# Patient Record
Sex: Male | Born: 1970
Health system: Southern US, Community
[De-identification: ages and names within clinical notes are randomized; demographics above are authoritative.]

## PROBLEM LIST (undated history)

## (undated) DIAGNOSIS — F32A Depression, unspecified: Secondary | ICD-10-CM

## (undated) DIAGNOSIS — Z8619 Personal history of other infectious and parasitic diseases: Secondary | ICD-10-CM

## (undated) DIAGNOSIS — I10 Essential (primary) hypertension: Secondary | ICD-10-CM

## (undated) DIAGNOSIS — G4730 Sleep apnea, unspecified: Secondary | ICD-10-CM

## (undated) DIAGNOSIS — E079 Disorder of thyroid, unspecified: Secondary | ICD-10-CM

## (undated) HISTORY — PX: BRAIN SURGERY: SHX531

## (undated) HISTORY — DX: Depression, unspecified: F32.A

## (undated) HISTORY — DX: Sleep apnea, unspecified: G47.30

## (undated) HISTORY — DX: Disorder of thyroid, unspecified: E07.9

## (undated) HISTORY — DX: Essential (primary) hypertension: I10

## (undated) HISTORY — DX: Personal history of other infectious and parasitic diseases: Z86.19

---
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
## 1999-11-09 ENCOUNTER — Ambulatory Visit (HOSPITAL_BASED_OUTPATIENT_CLINIC_OR_DEPARTMENT_OTHER): Admission: RE | Admit: 1999-11-09 | Discharge: 1999-11-09 | Payer: Self-pay | Admitting: Plastic Surgery

## 2003-03-25 ENCOUNTER — Ambulatory Visit (HOSPITAL_BASED_OUTPATIENT_CLINIC_OR_DEPARTMENT_OTHER): Admission: RE | Admit: 2003-03-25 | Discharge: 2003-03-25 | Payer: Self-pay | Admitting: Orthopedic Surgery

## 2004-03-13 HISTORY — PX: KNEE ARTHROSCOPY W/ ACL RECONSTRUCTION: SHX1858

## 2004-10-07 ENCOUNTER — Encounter: Admission: RE | Admit: 2004-10-07 | Discharge: 2004-10-07 | Payer: Self-pay | Admitting: Family Medicine

## 2011-05-30 ENCOUNTER — Ambulatory Visit (INDEPENDENT_AMBULATORY_CARE_PROVIDER_SITE_OTHER): Payer: BC Managed Care – PPO | Admitting: Family Medicine

## 2011-05-30 ENCOUNTER — Ambulatory Visit: Payer: BC Managed Care – PPO

## 2011-05-30 VITALS — BP 147/82 | HR 61 | Temp 98.5°F | Resp 16 | Ht 74.0 in | Wt 215.0 lb

## 2011-05-30 DIAGNOSIS — M79609 Pain in unspecified limb: Secondary | ICD-10-CM

## 2011-05-30 DIAGNOSIS — M7660 Achilles tendinitis, unspecified leg: Secondary | ICD-10-CM

## 2011-05-30 DIAGNOSIS — M79673 Pain in unspecified foot: Secondary | ICD-10-CM

## 2011-05-30 MED ORDER — TRAMADOL HCL 50 MG PO TABS: 50.0000 mg | ORAL_TABLET | Freq: Three times a day (TID) | ORAL | Status: AC | PRN

## 2011-05-30 NOTE — Progress Notes (Signed)
[facility]:  Office Visit  Chief Complaint:  Chief Complaint  Patient presents with  . Foot Pain    left heel pain  . Foot Swelling    HPI: Louis Perkins is a 41 y.o. male who complains of  2 week hx of left heel pain after started running on track after not having ran for a long time, sharp constant 9/10 pain going up his achilles. No arch pain. Denies steroids or antibiotic use. No hx of stress fx or osteoarthritis or prior taruam or injury. He was running on track when this happened. No prior episode. Described as intermittinet pain then became constant about 1 week ago. Has not tried anything for it except 1 x dose of NSAID, minimal relief  History reviewed. No pertinent past medical history. History reviewed. No pertinent past surgical history. History   Social History  . Marital Status: Divorced    Spouse Name: N/A    Number of Children: N/A  . Years of Education: N/A   Social History Main Topics  . Smoking status: Never Smoker   . Smokeless tobacco: None  . Alcohol Use: Yes  . Drug Use: No  . Sexually Active: None   Other Topics Concern  . None   Social History Narrative  . None   Family History  Problem Relation Age of Onset  . Heart disease Father    No Known Allergies Prior to Admission medications   Not on File     ROS: The patient denies fevers, chills, night sweats, unintentional weight loss, chest pain, palpitations, wheezing, dyspnea on exertion, nausea, vomiting, abdominal pain, dysuria, hematuria, melena, numbness, weakness, or tingling. + left heel and achilles tendon pain  All other systems have been reviewed and were otherwise negative with the exception of those mentioned in the HPI and as above.    PHYSICAL EXAM: Filed Vitals:   05/30/11 1159  BP: 147/82  Pulse: 61  Temp: 98.5 F (36.9 C)  Resp: 16   Filed Vitals:   05/30/11 1159  Height: 6\' 2"  (1.88 m)  Weight: 215 lb (97.523 kg)   Body mass index is 27.60  kg/(m^2).  General: Alert, no acute distress HEENT:  Normocephalic, atraumatic, oropharynx patent.  Cardiovascular:  Regular rate and rhythm, no rubs murmurs or gallops.  No Carotid bruits, radial pulse intact. No pedal edema.  Respiratory: Clear to auscultation bilaterally.  No wheezes, rales, or rhonchi.  No cyanosis, no use of accessory musculature GI: No organomegaly, abdomen is soft and non-tender, positive bowel sounds.  No masses. Skin: No rashes. Neurologic: Facial musculature symmetric. Psychiatric: Patient is appropriate throughout our interaction. Lymphatic: No cervical lymphadenopathy Musculoskeletal: Gait intact. Left foot:  Full ROM, 5/5 strength, sensation intact, 2/2  Ankle DTR. + tender at left heel, posterior achilles, no evidence of tendon rupture.    LABS: No results found for this or any previous visit.   EKG/XRAY:   Primary read interpreted by Dr. Conley Rolls at [facility]. No fx or dislocation. ? Small heel spur   ASSESSMENT/PLAN: Encounter Diagnoses  Name Primary?  . Heel pain Yes  . Achilles tendinitis    Etiology? Achilles Tendonitis vs Plantar Fasciitis. I think it may be more consistent with achilles tendonitis but potentially could be PF ( however sxs not completely c/w PF) Sxs treatment with Motrin 800 mg q 8 hrs, heel inserts, if breakthrough pain then Tramdol prn. Handout for Achilles tendonitis given    Louis Heggie PHUONG, DO 05/30/2011 1:37  PM

## 2011-07-18 ENCOUNTER — Emergency Department (HOSPITAL_COMMUNITY)
Admission: EM | Admit: 2011-07-18 | Discharge: 2011-07-18 | Payer: BC Managed Care – PPO | Source: Home / Self Care | Attending: Emergency Medicine | Admitting: Emergency Medicine

## 2011-07-19 ENCOUNTER — Ambulatory Visit (INDEPENDENT_AMBULATORY_CARE_PROVIDER_SITE_OTHER): Payer: Self-pay | Admitting: Family Medicine

## 2011-07-19 ENCOUNTER — Encounter: Payer: Self-pay | Admitting: Family Medicine

## 2011-07-19 VITALS — BP 146/88 | HR 64 | Temp 98.2°F | Ht 72.5 in | Wt 219.8 lb

## 2011-07-19 DIAGNOSIS — Z8249 Family history of ischemic heart disease and other diseases of the circulatory system: Secondary | ICD-10-CM | POA: Insufficient documentation

## 2011-07-19 DIAGNOSIS — R030 Elevated blood-pressure reading, without diagnosis of hypertension: Secondary | ICD-10-CM | POA: Insufficient documentation

## 2011-07-19 DIAGNOSIS — M7541 Impingement syndrome of right shoulder: Secondary | ICD-10-CM

## 2011-07-19 DIAGNOSIS — IMO0001 Reserved for inherently not codable concepts without codable children: Secondary | ICD-10-CM | POA: Insufficient documentation

## 2011-07-19 DIAGNOSIS — M7540 Impingement syndrome of unspecified shoulder: Secondary | ICD-10-CM | POA: Insufficient documentation

## 2011-07-19 DIAGNOSIS — M7660 Achilles tendinitis, unspecified leg: Secondary | ICD-10-CM | POA: Insufficient documentation

## 2011-07-19 LAB — COMPREHENSIVE METABOLIC PANEL
ALT: 32 U/L (ref 0–53)
AST: 36 U/L (ref 0–37)
Albumin: 4 g/dL (ref 3.5–5.2)
Alkaline Phosphatase: 43 U/L (ref 39–117)
BUN: 23 mg/dL (ref 6–23)
CO2: 28 mEq/L (ref 19–32)
Calcium: 9 mg/dL (ref 8.4–10.5)
Chloride: 102 mEq/L (ref 96–112)
Creatinine, Ser: 1.4 mg/dL (ref 0.4–1.5)
GFR: 71.87 mL/min (ref >60.00)
Glucose, Bld: 87 mg/dL (ref 70–99)
Potassium: 4.1 mEq/L (ref 3.5–5.1)
Sodium: 137 mEq/L (ref 135–145)
Total Bilirubin: 0.7 mg/dL (ref 0.3–1.2)
Total Protein: 7.2 g/dL (ref 6.0–8.3)

## 2011-07-19 LAB — LIPID PANEL
Cholesterol: 146 mg/dL (ref 0–200)
HDL: 57.3 mg/dL (ref >39.00)
LDL Cholesterol: 81 mg/dL (ref 0–99)
Total CHOL/HDL Ratio: 3
Triglycerides: 38 mg/dL (ref 0.0–149.0)
VLDL: 7.6 mg/dL (ref 0.0–40.0)

## 2011-07-19 NOTE — Progress Notes (Signed)
Subjective:    Patient ID: Louis Perkins, male    DOB: [birth year], 41 y.o.   MRN: [id number]  HPI  Very pleasant 41 yo male here to establish care.  Very active.  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  Achilles Tendinitis- left- went to [facility] a few weeks ago for left heel pain and diagnosed with Achilles Tendinitis.  Pain has been improving.  He ices the area after physical activity and has been using shoe inserts.    Right shoulder pain- was playing golf over the weekend- did not remember an injury.  Afterward, right shoulder very painful- difficulty lifting arm overhead.  No UE radiculopathy or weakness.  Pain improved with Ibuprofen but returns once Ibuprofen wears off.  No swelling.  Family h/o CAD- dad died of massive MI at 43.  Pt has not had lab work in over 2 years.  Tries to eat right.  Non smoker.  Elevated BP- never been told he has elevated BP in past.  BP elevated at urgent care but he was in pain.  Today was nervous about meeting new doctor.  No HA, blurred vision, CP or SOB. Sister is an Charity fundraiser.  Patient Active Problem List  Diagnoses  . Rotator cuff impingement syndrome, right  . Achilles tendonitis  . Family history of early CAD   Past Medical History  Diagnosis Date  . History of chicken pox    Past Surgical History  Procedure Date  . Adenoidectomy 1985   History  Substance Use Topics  . Smoking status: Never Smoker   . Smokeless tobacco: Not on file  . Alcohol Use: Yes   Family History  Problem Relation Age of Onset  . Heart disease Father   . Cancer Maternal Uncle     prostate CA  . Arthritis Maternal Grandmother   . Hypertension Maternal Grandmother   . Cancer Cousin     breast CA   No Known Allergies No current outpatient prescriptions on file prior to visit.   The PMH, PSH, Social History, Family History, Medications, and allergies have been reviewed in [facility], and have been updated if  relevant.    Review of Systems    See HPI Patient reports no  vision/ hearing changes,anorexia, weight change, fever ,adenopathy, persistant / recurrent hoarseness, swallowing issues, chest pain, edema,persistant / recurrent cough, hemoptysis, dyspnea(rest, exertional, paroxysmal nocturnal), gastrointestinal  bleeding (melena, rectal bleeding), abdominal pain, excessive heart burn, GU symptoms(dysuria, hematuria, pyuria, voiding/incontinence  Issues) syncope, focal weakness, severe memory loss, concerning skin lesions, depression, anxiety, abnormal bruising/bleeding, major joint swelling.    Objective:   Physical Exam  BP 146/88  Pulse 64  Temp(Src) 98.2 F (36.8 C) (Oral)  Ht 6' 0.5" (1.842 m)  Wt 219 lb 12 oz (99.678 kg)  BMI 29.39 kg/m2 General:  Muscular pleasant male, NAD. Eyes:  PERRL Ears:  External ear exam shows no significant lesions or deformities.  Otoscopic examination reveals clear canals, tympanic membranes are intact bilaterally without bulging, retraction, inflammation or discharge. Hearing is grossly normal bilaterally. Nose:  External nasal examination shows no deformity or inflammation. Nasal mucosa are pink and moist without lesions or exudates. Mouth:  Oral mucosa and oropharynx without lesions or exudates.  Teeth in good repair. Neck:  no carotid bruit or thyromegaly no cervical or supraclavicular lymphadenopathy  Lungs:  Normal respiratory effort, chest expands symmetrically. Lungs are clear to auscultation, no crackles or wheezes. Heart:  Normal rate and regular  rhythm. S1 and S2 normal without gallop, murmur, click, rub or other extra sounds. Abdomen:  Bowel sounds positive,abdomen soft and non-tender without masses, organomegaly or hernias noted. Pulses:  R and L posterior tibial pulses are full and equal bilaterally  Extremities:  no edema  MSK:  Right should normal to inspection and palpation.  Pos arch and pos empty can on right otherwise FROM and normal  strength.     Assessment & Plan:   1. Rotator cuff impingement syndrome, right New- supportive care with NSAIDs and exercises.  See pt instructions for details.  Also given handout from sports med advisor.   2. Achilles tendonitis  Improving.   3. Family history of early CAD  Comprehensive metabolic panel Lipid Panel  4. Elevated BP  New- likely white coat HTN. Asked pt to have his sister recheck BP and call us with readings. The patient indicates understanding of these issues and agrees with the plan.

## 2011-07-19 NOTE — Patient Instructions (Signed)
You have rotator cuff impingement Try to avoid painful activities (overhead activities, lifting with extended arm) as much as possible. Aleve and/or tylenol as needed for pain. Subacromial injection may be beneficial to help with pain and to decrease inflammation. Start exercises we discussed. If not improving at follow-up we will consider further imaging and/or physical therapy.  Please ask your sister to check you blood pressure a few times and call me with the readings.  We will call you with your lab results.

## 2011-09-05 ENCOUNTER — Ambulatory Visit (INDEPENDENT_AMBULATORY_CARE_PROVIDER_SITE_OTHER): Payer: BC Managed Care – PPO | Admitting: Family Medicine

## 2011-09-05 ENCOUNTER — Encounter: Payer: Self-pay | Admitting: Family Medicine

## 2011-09-05 VITALS — BP 110/82 | HR 78 | Temp 98.1°F | Wt 208.0 lb

## 2011-09-05 DIAGNOSIS — Z113 Encounter for screening for infections with a predominantly sexual mode of transmission: Secondary | ICD-10-CM

## 2011-09-05 DIAGNOSIS — H9190 Unspecified hearing loss, unspecified ear: Secondary | ICD-10-CM

## 2011-09-05 DIAGNOSIS — M7540 Impingement syndrome of unspecified shoulder: Secondary | ICD-10-CM

## 2011-09-05 DIAGNOSIS — H9191 Unspecified hearing loss, right ear: Secondary | ICD-10-CM | POA: Insufficient documentation

## 2011-09-05 DIAGNOSIS — M67919 Unspecified disorder of synovium and tendon, unspecified shoulder: Secondary | ICD-10-CM

## 2011-09-05 NOTE — Patient Instructions (Addendum)
Good to see you. Please stop by to see Louis Perkins on your way out to set up your referral. 

## 2011-09-05 NOTE — Progress Notes (Signed)
Subjective:    Patient ID: Louis Perkins, male    DOB: [birth year], 41 y.o.   MRN: [id number]  HPI  Very pleasant 41 yo male here for follow up right shoulder pain and right hearing loss.    Right shoulder pain- initially evaluated last month after was playing golf and right shoulder became very painful- difficulty lifting arm overhead.  No UE radiculopathy or weakness.  Tried conservative management- NSAIDs, exercises. Feeling much better, has an occasional "ache" in shoulder but otherwise feels remarkably better.  Right hearing loss- Has noticed over the past month, decreased hearing in right ear. No ear pain or ringing. No ear trauma.  He would like to get screened for HIV and RPR today.  No known exposures, no symptoms.  He likes to get tested once a year. No family h/o hearing loss. Patient Active Problem List  Diagnosis  . Rotator cuff impingement syndrome  . Achilles tendonitis  . Family history of early CAD  . Elevated BP  . Hearing loss in right ear   Past Medical History  Diagnosis Date  . History of chicken pox    Past Surgical History  Procedure Date  . Adenoidectomy 1985   History  Substance Use Topics  . Smoking status: Never Smoker   . Smokeless tobacco: Not on file  . Alcohol Use: Yes   Family History  Problem Relation Age of Onset  . Heart disease Father   . Cancer Maternal Uncle     prostate CA  . Arthritis Maternal Grandmother   . Hypertension Maternal Grandmother   . Cancer Cousin     breast CA   No Known Allergies No current outpatient prescriptions on file prior to visit.   The PMH, PSH, Social History, Family History, Medications, and allergies have been reviewed in [facility], and have been updated if relevant.    Review of Systems    See HPI   Objective:   Physical Exam BP 110/82  Pulse 78  Temp 98.1 F (36.7 C)  Wt 208 lb (94.348 kg)  General:  Muscular pleasant male, NAD. Eyes:  PERRL Ears:  External ear exam shows no  significant lesions or deformities.  Otoscopic examination reveals clear canals, tympanic membranes are intact bilaterally without bulging, retraction, inflammation or discharge. Hearing is grossly normal bilaterally. No cerumen visible Nose:  External nasal examination shows no deformity or inflammation. Nasal mucosa are pink and moist without lesions or exudates. Mouth:  Oral mucosa and oropharynx without lesions or exudates.  Teeth in good repair. Neck:  no carotid bruit or thyromegaly no cervical or supraclavicular lymphadenopathy  Lungs:  Normal respiratory effort, chest expands symmetrically. Lungs are clear to auscultation, no crackles or wheezes. Heart:  Normal rate and regular rhythm. S1 and S2 normal without gallop, murmur, click, rub or other extra sounds. Abdomen:  Bowel sounds positive,abdomen soft and non-tender without masses, organomegaly or hernias noted. Pulses:  R and L posterior tibial pulses are full and equal bilaterally  Extremities:  no edema  MSK:  Right should normal to inspection and palpation.  Neg arch and neg empty can on right with ROM and normal strength.     Assessment & Plan:   1. Rotator cuff impingement syndrome  Improved. Continue supportive care. Pt to let me know if symptoms deteriorate.   2. Hearing loss in right ear  New.  Does have hearing loss in right ear. Refer to audiology for further work up. Ambulatory referral to Audiology  3. Screening  for STD (sexually transmitted disease)  HIV Antibody, RPR

## 2011-09-06 LAB — RPR

## 2011-09-06 LAB — HIV ANTIBODY (ROUTINE TESTING W REFLEX): HIV: NONREACTIVE

## 2013-02-10 ENCOUNTER — Encounter: Payer: Self-pay | Admitting: Family Medicine

## 2013-02-10 ENCOUNTER — Ambulatory Visit (INDEPENDENT_AMBULATORY_CARE_PROVIDER_SITE_OTHER): Payer: BC Managed Care – PPO | Admitting: Family Medicine

## 2013-02-10 ENCOUNTER — Ambulatory Visit (INDEPENDENT_AMBULATORY_CARE_PROVIDER_SITE_OTHER)
Admission: RE | Admit: 2013-02-10 | Discharge: 2013-02-10 | Disposition: A | Discharge status: Home / Self Care | Payer: BC Managed Care – PPO | Source: Ambulatory Visit | Attending: Family Medicine | Admitting: Family Medicine

## 2013-02-10 VITALS — BP 100/80 | HR 52 | Temp 98.4°F | Ht 72.25 in | Wt 203.8 lb

## 2013-02-10 DIAGNOSIS — M25569 Pain in unspecified knee: Secondary | ICD-10-CM

## 2013-02-10 DIAGNOSIS — M259 Joint disorder, unspecified: Secondary | ICD-10-CM

## 2013-02-10 DIAGNOSIS — M222X2 Patellofemoral disorders, left knee: Secondary | ICD-10-CM

## 2013-02-10 DIAGNOSIS — M67919 Unspecified disorder of synovium and tendon, unspecified shoulder: Secondary | ICD-10-CM

## 2013-02-10 DIAGNOSIS — M25562 Pain in left knee: Secondary | ICD-10-CM

## 2013-02-10 DIAGNOSIS — M7541 Impingement syndrome of right shoulder: Secondary | ICD-10-CM

## 2013-02-10 MED ORDER — OXAPROZIN 600 MG PO TABS: 1200.0000 mg | ORAL_TABLET | Freq: Every day | ORAL | Status: DC

## 2013-02-10 NOTE — Progress Notes (Signed)
Pre-visit discussion using our clinic review tool. No additional management support is needed unless otherwise documented below in the visit note.  

## 2013-02-10 NOTE — Progress Notes (Signed)
Date:  02/10/2013   Name:  Louis Perkins   DOB:  04-13-1970   MRN:  [id number] Gender: male Age: 42 y.o.  Primary Physician:  Ruthe Mannan, MD   Chief Complaint: Knee Pain and Shoulder Pain   Subjective:   History of Present Illness:  Louis Perkins is a 42 y.o. very pleasant male patient who presents with the following:  Left knee pain Superior patella, started out doing squats, then a lot of pain afterwards, now even out of a chair.  Nothing with a L  R ACL reconstruction.  Took about a week off. Did some machine work. Then took about 2 weeks off.  Using some knee wraps.   Patient fairly anterior in nature. It is worse when he is going up and down stairs and when he does deep squats. He has not had any kind significant effusion. His knee is not buckling, and he is not having any mechanical symptoms. No history of LEFT knee operative intervention her fracture.  Right shoulder: Dr. Dayton Martes thought maybe a rotator cuff tendinopathy and impingement. All on the RIGHT. The patient does lift weights routinely and he is right-hand dominant. 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 Does do some light RTC work.  Never - dislocated.  RHD.   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  Past Medical History, Surgical History, Social History, Family History, Problem List, Medications, and Allergies have been reviewed and updated if relevant.  Review of Systems:  GEN: No fevers, chills. Nontoxic. Primarily MSK c/o today. MSK: Detailed in the HPI GI: tolerating PO intake without difficulty Neuro: No numbness, parasthesias, or tingling associated. Otherwise the pertinent positives of the ROS are noted above.   Objective:   Physical Examination: BP 100/80  Pulse 52  Temp(Src) 98.4 F (36.9 C) (Oral)  Ht 6' 0.25" (1.835 m)  Wt 203 lb 12 oz (92.42 kg)  BMI 27.45 kg/m2   GEN: Well-developed,well-nourished,in no acute distress;  alert,appropriate and cooperative throughout examination HEENT: Normocephalic and atraumatic without obvious abnormalities. Ears, externally no deformities PULM: Breathing comfortably in no respiratory distress EXT: No clubbing, cyanosis, or edema PSYCH: Normally interactive. Cooperative during the interview. Pleasant. Friendly and conversant. Not anxious or depressed appearing. Normal, full affect.  Shoulder: R Inspection: No muscle wasting or winging Ecchymosis/edema: neg  AC joint, scapula, clavicle: NT Cervical spine: NT, full ROM Spurling's: neg Abduction: full, 5/5 Flexion: full, 5/5 IR, full, lift-off: 5/5 - RELATIVE LOSS OF 40 DEG ON THE RIGHT COMPARED TO THE LEFT ER at neutral: full, 5/5 AC crossover: neg Neer: pos Hawkins: pos Drop Test: neg Empty Can: neg Supraspinatus insertion: nt Bicipital groove: NT Speed's: neg Yergason's: neg Sulcus sign: neg Scapular dyskinesis: none C5-T1 intact  Neuro: Sensation intact Grip 5/5   Knee: L Gait: Normal heel toe pattern ROM: WNL Effusion: neg Echymosis or edema: none Patellar tendon NT Painful PLICA: neg Patellar grind: POS - more superiorly Medial and lateral patellar facet loading: POSITIVE medial and lateral joint lines:NT Mcmurray's neg Flexion-pinch neg Varus and valgus stress: stable Lachman: neg Ant and Post drawer: neg Hip abduction, IR, ER: WNL Hip flexion str: 5/5 Hip abd: 5/5 Quad: 5/5 VMO atrophy: none Hamstring concentric and eccentric: 5/5   Radiology: Dg Knee Ap/lat W/sunrise Left  02/10/2013   CLINICAL DATA:  Left knee pain.  EXAM: DG KNEE - 3 VIEWS  COMPARISON:  None.  FINDINGS: There is no fracture  or dislocation. There is a small joint effusion. Minimal osteophytes on the patella.  IMPRESSION: Small joint effusion.  Slight degenerative changes of the patella.   Electronically Signed   By: Geanie Cooley M.D.   On: 02/10/2013 15:01    Assessment & Plan:    Left knee pain - Plan: DG Knee  AP/LAT W/Sunrise Left  Rotator cuff impingement syndrome, right  Patellofemoral disorder of left knee  We start him on some regular anti-inflammatories daily for the next 3-4 weeks. I suspected this is all coming from the patellofemoral joint which does have some mild arthritic change.  His shoulder I think is coming from GIRD, which is driving his impingement. I gave him some posterior capsular stretches from [facility], as well as some rotator cuff strengthening and scapular stabilization from them as well.  If he is still having some issues in a couple of months I asked him to followup with me  Orders Today:  Orders Placed This Encounter  Procedures  . DG Knee AP/LAT W/Sunrise Left    New medications, updates to list, dose adjustments: Meds ordered this encounter  Medications  . oxaprozin (DAYPRO) 600 MG tablet    Sig: Take 2 tablets (1,200 mg total) by mouth daily.    Dispense:  30 tablet    Refill:  4    Signed,  Conda Wannamaker T. Nuala Chiles, MD, CAQ Sports Medicine  [facility] [address] [place] [address] Phone: [phone] Fax: [phone]  Updated Complete Medication List:   Medication List       This list is accurate as of: 02/10/13 11:59 PM.  Always use your most recent med list.               oxaprozin 600 MG tablet  Commonly known as:  DAYPRO  Take 2 tablets (1,200 mg total) by mouth daily.

## 2013-04-23 ENCOUNTER — Other Ambulatory Visit: Payer: Self-pay | Admitting: Otolaryngology

## 2013-04-23 DIAGNOSIS — H9190 Unspecified hearing loss, unspecified ear: Secondary | ICD-10-CM

## 2013-05-01 ENCOUNTER — Ambulatory Visit
Admission: RE | Admit: 2013-05-01 | Discharge: 2013-05-01 | Disposition: A | Discharge status: Home / Self Care | Payer: BC Managed Care – PPO | Source: Ambulatory Visit | Attending: Otolaryngology | Admitting: Otolaryngology

## 2013-05-01 DIAGNOSIS — H9190 Unspecified hearing loss, unspecified ear: Secondary | ICD-10-CM

## 2013-05-05 ENCOUNTER — Ambulatory Visit
Admission: RE | Admit: 2013-05-05 | Discharge: 2013-05-05 | Disposition: A | Discharge status: Home / Self Care | Payer: BC Managed Care – PPO | Source: Ambulatory Visit | Attending: Otolaryngology | Admitting: Otolaryngology

## 2013-05-05 MED ORDER — GADOBENATE DIMEGLUMINE 529 MG/ML IV SOLN
20.0000 mL | Freq: Once | INTRAVENOUS | Status: AC | PRN
  Administered 2013-05-05: 20 mL via INTRAVENOUS

## 2013-07-04 DIAGNOSIS — Z86011 Personal history of benign neoplasm of the brain: Secondary | ICD-10-CM | POA: Insufficient documentation

## 2013-09-11 ENCOUNTER — Other Ambulatory Visit: Payer: Self-pay | Admitting: Otolaryngology

## 2013-09-11 DIAGNOSIS — H905 Unspecified sensorineural hearing loss: Secondary | ICD-10-CM

## 2013-09-11 DIAGNOSIS — Z86011 Personal history of benign neoplasm of the brain: Secondary | ICD-10-CM

## 2014-03-30 ENCOUNTER — Encounter (HOSPITAL_COMMUNITY): Payer: Self-pay | Admitting: Family Medicine

## 2014-03-30 ENCOUNTER — Emergency Department (HOSPITAL_COMMUNITY)
Admission: EM | Admit: 2014-03-30 | Discharge: 2014-03-30 | Disposition: A | Discharge status: Home / Self Care | Payer: 59 | Attending: Emergency Medicine | Admitting: Emergency Medicine

## 2014-03-30 DIAGNOSIS — Y998 Other external cause status: Secondary | ICD-10-CM | POA: Diagnosis not present

## 2014-03-30 DIAGNOSIS — Y92410 Unspecified street and highway as the place of occurrence of the external cause: Secondary | ICD-10-CM | POA: Diagnosis not present

## 2014-03-30 DIAGNOSIS — Y9389 Activity, other specified: Secondary | ICD-10-CM | POA: Insufficient documentation

## 2014-03-30 DIAGNOSIS — S161XXA Strain of muscle, fascia and tendon at neck level, initial encounter: Secondary | ICD-10-CM

## 2014-03-30 DIAGNOSIS — S0990XA Unspecified injury of head, initial encounter: Secondary | ICD-10-CM | POA: Diagnosis not present

## 2014-03-30 DIAGNOSIS — Z8619 Personal history of other infectious and parasitic diseases: Secondary | ICD-10-CM | POA: Insufficient documentation

## 2014-03-30 DIAGNOSIS — Z792 Long term (current) use of antibiotics: Secondary | ICD-10-CM | POA: Diagnosis not present

## 2014-03-30 DIAGNOSIS — S199XXA Unspecified injury of neck, initial encounter: Secondary | ICD-10-CM | POA: Diagnosis present

## 2014-03-30 MED ORDER — NAPROXEN 500 MG PO TABS
500.0000 mg | ORAL_TABLET | Freq: Two times a day (BID) | ORAL | Status: DC
Start: 2014-03-30 — End: 2016-08-21

## 2014-03-30 MED ORDER — HYDROCODONE-ACETAMINOPHEN 5-325 MG PO TABS: 2.0000 | ORAL_TABLET | ORAL | Status: DC | PRN

## 2014-03-30 MED ORDER — KETOROLAC TROMETHAMINE 60 MG/2ML IM SOLN
60.0000 mg | Freq: Once | INTRAMUSCULAR | Status: AC
  Administered 2014-03-30: 60 mg via INTRAMUSCULAR
  Filled 2014-03-30: qty 2

## 2014-03-30 MED ORDER — CYCLOBENZAPRINE HCL 10 MG PO TABS: 10.0000 mg | ORAL_TABLET | Freq: Two times a day (BID) | ORAL | Status: DC | PRN
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
## 2014-03-30 NOTE — Discharge Instructions (Signed)
Please call your doctor for a followup appointment within 24-48 hours. When you talk to your doctor please let them know that you were seen in the emergency department and have them acquire all of your records so that they can discuss the findings with you and formulate a treatment plan to fully care for your new and ongoing problems. ° °
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
## 2014-04-01 ENCOUNTER — Encounter: Payer: Self-pay | Admitting: Internal Medicine

## 2014-04-01 ENCOUNTER — Ambulatory Visit (INDEPENDENT_AMBULATORY_CARE_PROVIDER_SITE_OTHER): Payer: 59 | Admitting: Internal Medicine

## 2014-04-01 VITALS — BP 116/80 | HR 72 | Temp 98.5°F | Wt 207.0 lb

## 2014-04-01 DIAGNOSIS — R110 Nausea: Secondary | ICD-10-CM

## 2014-04-01 DIAGNOSIS — R519 Headache, unspecified: Secondary | ICD-10-CM

## 2014-04-01 DIAGNOSIS — R51 Headache: Secondary | ICD-10-CM

## 2014-04-01 DIAGNOSIS — R42 Dizziness and giddiness: Secondary | ICD-10-CM

## 2014-04-01 MED ORDER — ONDANSETRON HCL 4 MG PO TABS: 4.0000 mg | ORAL_TABLET | Freq: Three times a day (TID) | ORAL | Status: DC | PRN

## 2014-04-01 NOTE — Progress Notes (Signed)
Major complaint - 6/10.Marland KitchenMarland Kitchen4/10 Haven't been able to eat since Monday - nauseous  Ibuprofen for headache [facility] didn't offer imaging - [place]  Neck sore - shot and neck pain gone  Turn head a little bit - blurriness, no falls Flexeril and naproxen  No confusion Neuroma removed in May  HPI  Pt presents to the clinic today with c/o  x   Review of Systems      Past Medical History  Diagnosis Date  . History of chicken pox     Family History  Problem Relation Age of Onset  . Heart disease Father   . Cancer Maternal Uncle     prostate CA  . Arthritis Maternal Grandmother   . Hypertension Maternal Grandmother   . Cancer Cousin     breast CA    History   Social History  . Marital Status: Divorced    Spouse Name: N/A    Number of Children: N/A  . Years of Education: N/A   Occupational History  . Not on file.   Social History Main Topics  . Smoking status: Never Smoker   . Smokeless tobacco: Never Used  . Alcohol Use: Yes  . Drug Use: No  . Sexual Activity: Not on file   Other Topics Concern  . Not on file   Social History Narrative    No Known Allergies   Constitutional: Positive headache, fatigue and fever. Denies abrupt weight changes.  HEENT:  Positive sore throat. Denies eye redness, eye pain, pressure behind the eyes, facial pain, nasal congestion, ear pain, ringing in the ears, wax buildup, runny nose or bloody nose. Respiratory: Positive cough. Denies difficulty breathing or shortness of breath.  Cardiovascular: Denies chest pain, chest tightness, palpitations or swelling in the hands or feet.   No other specific complaints in a complete review of systems (except as listed in HPI above).  Objective:   BP 116/80 mmHg  Pulse 72  Temp(Src) 98.5 F (36.9 C) (Oral)  Wt 207 lb (93.895 kg)  SpO2 98% Wt Readings from Last 3 Encounters:  04/01/14 207 lb (93.895 kg)  02/10/13 203 lb 12 oz (92.42 kg)  09/05/11 208 lb (94.348 kg)      General: Appears his stated age, well developed, well nourished in NAD. HEENT: Head: normal shape and size; Eyes: sclera white, no icterus, conjunctiva pink, PERRLA and EOMs intact; Ears: Tm's gray and intact, normal light reflex; Nose: mucosa pink and moist, septum midline; Throat/Mouth: + PND. Teeth present, mucosa erythematous and moist, no exudate noted, no lesions or ulcerations noted.  Neck: Mild cervical lymphadenopathy. Neck supple, trachea midline. No massses, lumps or thyromegaly present.  Cardiovascular: Normal rate and rhythm. S1,S2 noted.  No murmur, rubs or gallops noted. No JVD or BLE edema. No carotid bruits noted. Pulmonary/Chest: Normal effort and positive vesicular breath sounds. No respiratory distress. No wheezes, rales or ronchi noted.      Assessment & Plan:       RTC as needed or if symptoms persist.
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
## 2014-04-01 NOTE — Patient Instructions (Addendum)
Post-Concussion Syndrome Post-concussion syndrome describes the symptoms that can occur after a head injury. These symptoms can last from weeks to months. CAUSES  It is not clear why some head injuries cause post-concussion syndrome. It can occur whether your head injury was mild or severe and whether you were wearing head protection or not.  SIGNS AND SYMPTOMS  Memory difficulties.  Dizziness.  Headaches.  Double vision or blurry vision.  Sensitivity to light.  Hearing difficulties.  Depression.  Tiredness.  Weakness.  Difficulty with concentration.  Difficulty sleeping or staying asleep.  Vomiting.  Poor balance or instability on your feet.  Slow reaction time.  Difficulty learning and remembering things you have heard. DIAGNOSIS  There is no test to determine whether you have post-concussion syndrome. Your health care provider may order an imaging scan of your brain, such as a CT scan, to check for other problems that may be causing your symptoms (such as severe injury inside your skull). TREATMENT  Usually, these problems disappear over time without medical care. Your health care provider may prescribe medicine to help ease your symptoms. It is important to follow up with a neurologist to evaluate your recovery and address any lingering symptoms or issues. HOME CARE INSTRUCTIONS   Only take over-the-counter or prescription medicines for pain, discomfort, or fever as directed by your health care provider. Do not take aspirin. Aspirin can slow blood clotting.  Sleep with your head slightly elevated to help with headaches.  Avoid any situation where there is potential for another head injury (football, hockey, soccer, basketball, martial arts, downhill snow sports, and horseback riding). Your condition will get worse every time you experience a concussion. You should avoid these activities until you are evaluated by the appropriate follow-up health care  providers.  Keep all follow-up appointments as directed by your health care provider. SEEK IMMEDIATE MEDICAL CARE IF:  You develop confusion or unusual drowsiness.  You cannot wake the injured person.  You develop nausea or persistent, forceful vomiting.  You feel like you are moving when you are not (vertigo).  You notice the injured person's eyes moving rapidly back and forth. This may be a sign of vertigo.  You have convulsions or faint.  You have severe, persistent headaches that are not relieved by medicine.  You cannot use your arms or legs normally.  Your pupils change size.  You have clear or bloody discharge from the nose or ears.  Your problems are getting worse, not better. MAKE SURE YOU:  Understand these instructions.  Will watch your condition.  Will get help right away if you are not doing well or get worse. Document Released: 08/19/2001 Document Revised: 12/18/2012 Document Reviewed: 06/04/2013 ExitCare Patient Information 2015 [employer]. This information is not intended to replace advice given to you by your health care provider. Make sure you discuss any questions you have with your health care provider.  

## 2014-04-01 NOTE — Progress Notes (Signed)
Pre visit review using our clinic review tool, if applicable. No additional management support is needed unless otherwise documented below in the visit note. 

## 2014-04-02 ENCOUNTER — Ambulatory Visit (INDEPENDENT_AMBULATORY_CARE_PROVIDER_SITE_OTHER)
Admission: RE | Admit: 2014-04-02 | Discharge: 2014-04-02 | Disposition: A | Discharge status: Home / Self Care | Payer: 59 | Source: Ambulatory Visit | Attending: Internal Medicine | Admitting: Internal Medicine

## 2014-04-02 DIAGNOSIS — R110 Nausea: Secondary | ICD-10-CM

## 2014-04-02 DIAGNOSIS — R42 Dizziness and giddiness: Secondary | ICD-10-CM

## 2014-04-02 DIAGNOSIS — R51 Headache: Secondary | ICD-10-CM

## 2014-04-02 DIAGNOSIS — R519 Headache, unspecified: Secondary | ICD-10-CM

## 2014-07-17 ENCOUNTER — Ambulatory Visit
Admission: RE | Admit: 2014-07-17 | Discharge: 2014-07-17 | Disposition: A | Discharge status: Home / Self Care | Payer: 59 | Source: Ambulatory Visit | Attending: Otolaryngology | Admitting: Otolaryngology

## 2014-07-17 DIAGNOSIS — Z86011 Personal history of benign neoplasm of the brain: Secondary | ICD-10-CM

## 2014-07-17 DIAGNOSIS — H905 Unspecified sensorineural hearing loss: Secondary | ICD-10-CM

## 2014-07-17 MED ORDER — GADOBENATE DIMEGLUMINE 529 MG/ML IV SOLN: 20.0000 mL | Freq: Once | INTRAVENOUS | Status: AC | PRN

## 2016-08-21 ENCOUNTER — Ambulatory Visit (INDEPENDENT_AMBULATORY_CARE_PROVIDER_SITE_OTHER): Payer: 59 | Admitting: Family Medicine

## 2016-08-21 ENCOUNTER — Ambulatory Visit (INDEPENDENT_AMBULATORY_CARE_PROVIDER_SITE_OTHER)
Admission: RE | Admit: 2016-08-21 | Discharge: 2016-08-21 | Disposition: A | Discharge status: Home / Self Care | Payer: 59 | Source: Ambulatory Visit | Attending: Family Medicine | Admitting: Family Medicine

## 2016-08-21 ENCOUNTER — Encounter: Payer: Self-pay | Admitting: Family Medicine

## 2016-08-21 VITALS — BP 120/80 | HR 62 | Temp 98.4°F | Ht 72.5 in | Wt 208.5 lb

## 2016-08-21 DIAGNOSIS — D333 Benign neoplasm of cranial nerves: Secondary | ICD-10-CM | POA: Insufficient documentation

## 2016-08-21 DIAGNOSIS — M25561 Pain in right knee: Secondary | ICD-10-CM | POA: Diagnosis not present

## 2016-08-21 NOTE — Progress Notes (Signed)
Dr. Frederico Hamman T. Dalayah Deahl, MD, [facility] [place], [address] Phone: [phone] Fax: [phone]  08/21/2016  Patient: Louis Perkins, MRN: [id number], DOB: 28-Apr-1970, 46 y.o.  Primary Physician:  Louis Passy, MD   Chief Complaint  Patient presents with  . Knee Pain    Right   Subjective:   Louis Perkins is a 46 y.o. very pleasant male patient who presents with the following:  Knee has been aching cranky for a while. Came down and started twisted. Pain with sitting for a while.  ACL reconstruction 12 years ago with a cadaver. Had some swelling initially.   First hurt his knee 3 months ago.  Able to go to class. He does TKD with [name] and also lifts weights regularly.   No audible pop was heard. The patient has had a mild effusion. No symptomatic giving-way. No mechanical clicking. Joint has not locked up. Patient has been able to walk but is not singificantly limping. The patient does not have pain going up and down stairs or rising from a seated position.   Pain location: Medial Current physical activity: TKD Prior Knee Surgery: ACL Reconstruction cadaver graft, 12 years ago Current pain meds: Bracing: Y  Past Medical History, Surgical History, Social History, Family History, Problem List, Medications, and Allergies have been reviewed and updated if relevant.  Patient Active Problem List   Diagnosis Date Noted  . 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 History of benign brain tumor 07/04/2013  . Hearing loss in right ear 09/05/2011  . Rotator cuff impingement syndrome 07/19/2011  . Achilles tendonitis 07/19/2011  . Family history of early CAD 07/19/2011  . Elevated BP 07/19/2011    Past Medical History:  Diagnosis Date  . History of chicken pox     Past Surgical History:  Procedure Laterality Date  . ADENOIDECTOMY  1985  . BRAIN SURGERY      Social History   Social  History  . Marital status: Divorced    Spouse name: N/A  . Number of children: N/A  . Years of education: N/A   Occupational History  . Not on file.   Social History Main Topics  . Smoking status: Never Smoker  . Smokeless tobacco: Never Used  . Alcohol use Yes  . Drug use: No  . Sexual activity: Not on file   Other Topics Concern  . Not on file   Social History Narrative  . No narrative on file    Family History  Problem Relation Age of Onset  . Heart disease Father   . Cancer Maternal Uncle        prostate CA  . Arthritis Maternal Grandmother   . Hypertension Maternal Grandmother   . Cancer Cousin        breast CA    No Known Allergies  Medication list reviewed and updated in full in [place].  GEN: No fevers, chills. Nontoxic. Primarily MSK c/o today. MSK: Detailed in the HPI GI: tolerating PO intake without difficulty Neuro: No numbness, parasthesias, or tingling associated. Otherwise the pertinent positives of the ROS are noted above.   Objective:   BP 120/80   Pulse 62   Temp 98.4 F (36.9 C) (Oral)   Ht 6' 0.5" (1.842 m)   Wt 208 lb 8 oz (94.6 kg)   BMI 27.89 kg/m    GEN: WDWN, NAD, Non-toxic, Alert & Oriented x  3 HEENT: Atraumatic, Normocephalic.  Ears and Nose: No external deformity. EXTR: No clubbing/cyanosis/edema NEURO: Normal gait.  PSYCH: Normally interactive. Conversant. Not depressed or anxious appearing.  Calm demeanor.   Knee:  0-125 Gait: Normal heel toe pattern ROM: R Effusion: mild Echymosis or edema: none Patellar tendon NT Painful PLICA: neg Patellar grind: negative Medial and lateral patellar facet loading: negative medial and lateral joint lines: mild medial joint line pain Mcmurray's Pos for pain Flexion-pinch pos Varus and valgus stress: stable Lachman: neg Ant and Post drawer: neg Hip abduction, IR, ER: WNL Hip flexion str: 5/5 Hip abd: 5/5 Quad: 5/5 VMO atrophy:No Hamstring concentric and  eccentric: 5/5   Radiology: No results found.   Assessment and Plan:   Right knee pain, unspecified chronicity - Plan: DG Knee 4 Views W/Patella Right  >25 minutes spent in face to face time with patient, >50% spent in counselling or coordination of care   Discussed all anatomy and plain films. ACL appears intact. History and exam c/w meniscal tear in an athletic 46 year old. Reviewed options, PT, MRI, arthroscopy and outcomes. For now, he is fairly function and will continue with his modified weight workouts and continue with TKD.  Follow-up: prn if needed  Medications Discontinued During This Encounter  Medication Reason  . ondansetron (ZOFRAN) 4 MG tablet Completed Course  . naproxen (NAPROSYN) 500 MG tablet Completed Course  . HYDROcodone-acetaminophen (NORCO/VICODIN) 5-325 MG per tablet Completed Course  . cyclobenzaprine (FLEXERIL) 10 MG tablet Completed Course   Orders Placed This Encounter  Procedures  . DG Knee 4 Views W/Patella Right    Signed,  Frederico Hamman T. Vincente Asbridge, MD   Allergies as of 08/21/2016   No Known Allergies     Medication List    as of 08/21/2016 11:11 AM   You have not been prescribed any medications.

## 2016-11-15 ENCOUNTER — Ambulatory Visit (INDEPENDENT_AMBULATORY_CARE_PROVIDER_SITE_OTHER): Payer: 59 | Admitting: Family Medicine

## 2016-11-15 ENCOUNTER — Encounter: Payer: Self-pay | Admitting: Family Medicine

## 2016-11-15 VITALS — BP 120/80 | HR 64 | Temp 98.4°F | Ht 72.5 in | Wt 209.8 lb

## 2016-11-15 DIAGNOSIS — M25561 Pain in right knee: Secondary | ICD-10-CM | POA: Diagnosis not present

## 2016-11-15 DIAGNOSIS — M25461 Effusion, right knee: Secondary | ICD-10-CM

## 2016-11-15 NOTE — Progress Notes (Signed)
Dr. Frederico Hamman T. Magalie Almon, MD, [facility] [place], [address] Phone: [phone] Fax: [phone]  11/15/2016  Patient: Louis Perkins, MRN: [id number], DOB: 01-Dec-1970, 46 y.o.  Primary Physician:  Lucille Passy, MD   Chief Complaint  Patient presents with  . Knee Pain    with Swelling-Right   Subjective:   Louis Perkins is a 46 y.o. very pleasant male patient who presents with the following:  F/u R knee: Very nice patient who I originally saw in June with right knee pain, and his pain is been ongoing for approximately 6 months. He does have a history of having a anterior cruciate ligament reconstruction on the right side with a cadaveric graft approximately 12 years ago done by [facility] orthopedics. Currently he has been able to lift weights and he has been training around his knee with a knee brace, but today he has a very large effusion and is limited in his ability to extend and flex his knee.  He has been doing a lot of quadricep rehabilitation and training around this to try to rehabilitation through the knee pain. He also has been wearing a knee brace. He does do tae kwon do regularly.  Had some testing last Tuesday. R knee - had some buckling since Tuesday. Did have some mechanical sticking of his knee.   Past Medical History, Surgical History, Social History, Family History, Problem List, Medications, and Allergies have been reviewed and updated if relevant.  Patient Active Problem List   Diagnosis Date Noted  . 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 History of benign brain tumor 07/04/2013  . Hearing loss in right ear 09/05/2011  . Rotator cuff impingement syndrome 07/19/2011  . Achilles tendonitis 07/19/2011  . Family history of early CAD 07/19/2011  . Elevated BP 07/19/2011    Past Medical History:  Diagnosis Date  . History of chicken pox     Past Surgical History:  Procedure  Laterality Date  . ADENOIDECTOMY  1985  . BRAIN SURGERY    . KNEE ARTHROSCOPY W/ ACL RECONSTRUCTION Right 2006   Cadaver graft    Social History   Social History  . Marital status: Divorced    Spouse name: N/A  . Number of children: N/A  . Years of education: N/A   Occupational History  . Not on file.   Social History Main Topics  . Smoking status: Never Smoker  . Smokeless tobacco: Never Used  . Alcohol use Yes  . Drug use: No  . Sexual activity: Not on file   Other Topics Concern  . Not on file   Social History Narrative  . No narrative on file    Family History  Problem Relation Age of Onset  . Heart disease Father   . Cancer Maternal Uncle        prostate CA  . Arthritis Maternal Grandmother   . Hypertension Maternal Grandmother   . Cancer Cousin        breast CA    No Known Allergies  Medication list reviewed and updated in full in [place].  GEN: No fevers, chills. Nontoxic. Primarily MSK c/o today. MSK: Detailed in the HPI GI: tolerating PO intake without difficulty Neuro: No numbness, parasthesias, or tingling associated. Otherwise the pertinent positives of the ROS are noted above.   Objective:   BP 120/80   Pulse 64   Temp 98.4 F (36.9 C) (  Oral)   Ht 6' 0.5" (1.842 m)   Wt 209 lb 12 oz (95.1 kg)   BMI 28.06 kg/m    GEN: WDWN, NAD, Non-toxic, Alert & Oriented x 3 HEENT: Atraumatic, Normocephalic.  Ears and Nose: No external deformity. EXTR: No clubbing/cyanosis/edema NEURO: Normal gait.  PSYCH: Normally interactive. Conversant. Not depressed or anxious appearing.  Calm demeanor.   Right knee, patient lacks 5 of extension. Flexion to 95. He has a large ballotable effusion. There is notable medial joint line tenderness. No significant lateral joint line tenderness. MCL and LCL are stable. On both the right and the left knee there is increased give on Lachman maneuver. There appears to be an end point. PCL was intact. Bounce  home is positive. Flexion pinch is positive.  Radiology: Dg Knee 4 Views W/patella Right  Result Date: 08/21/2016 CLINICAL DATA:  Right knee pain for 3 months. Pain and swelling with exercise. EXAM: RIGHT KNEE - COMPLETE 4+ VIEW COMPARISON:  None. FINDINGS: Bones: No fracture or dislocation. Normal bone mineralization. Prior right ACL repair with interference screws in satisfactory position. Joints: Normal alignment. No erosive changes. Mild right lateral femorotibial compartment joint space narrowing. No significant medial femorotibial compartment joint space narrowing. Mild osteoarthritis of the right patellofemoral compartment. Moderate right knee joint effusion. Soft tissue: No soft tissue abnormality. No radiopaque foreign body. No subcutaneous emphysema. IMPRESSION: 1. Mild lateral femorotibial compartment and patellofemoral compartment osteoarthritis. Moderate right knee joint effusion. Electronically Signed   By: Kathreen Devoid   On: 08/21/2016 12:14   Assessment and Plan:   Right knee pain, unspecified chronicity - Plan: MR Knee Right Wo Contrast  Effusion of right knee joint - Plan: MR Knee Right Wo Contrast  6 months of knee pain with failure of conservative management, strengthening, rehabilitation, anti-inflammatories, bracing. Evaluate right knee with MRI without contrast. History of prior anterior cruciate ligament reconstruction. Evaluate integrity of anterior cruciate ligament graft, as well as for meniscal injury.  Prior surgery at [facility].  Follow-up: No Follow-up on file.  Future Appointments Date Time Provider [place]  11/23/2016 7:10 AM GI-315 MR 3 [id number] GI-315 [address] [facility]   Orders Placed This Encounter  Procedures  . MR Knee Right Wo Contrast    Signed,  Braxxton Stoudt T. Ronnette Rump, MD   Patient's Medications   No medications on file

## 2016-11-15 NOTE — Patient Instructions (Signed)

## 2016-11-23 ENCOUNTER — Ambulatory Visit
Admission: RE | Admit: 2016-11-23 | Discharge: 2016-11-23 | Disposition: A | Discharge status: Home / Self Care | Payer: 59 | Source: Ambulatory Visit | Attending: Family Medicine | Admitting: Family Medicine

## 2016-11-23 DIAGNOSIS — M25561 Pain in right knee: Secondary | ICD-10-CM | POA: Diagnosis not present

## 2016-11-23 DIAGNOSIS — M25461 Effusion, right knee: Secondary | ICD-10-CM

## 2016-11-27 ENCOUNTER — Other Ambulatory Visit: Payer: Self-pay | Admitting: Family Medicine

## 2016-11-27 DIAGNOSIS — S83241A Other tear of medial meniscus, current injury, right knee, initial encounter: Secondary | ICD-10-CM

## 2016-11-27 DIAGNOSIS — Z9889 Other specified postprocedural states: Secondary | ICD-10-CM

## 2016-11-30 DIAGNOSIS — M25561 Pain in right knee: Secondary | ICD-10-CM | POA: Diagnosis not present

## 2016-12-20 DIAGNOSIS — M23221 Derangement of posterior horn of medial meniscus due to old tear or injury, right knee: Secondary | ICD-10-CM | POA: Diagnosis not present

## 2016-12-20 DIAGNOSIS — M23203 Derangement of unspecified medial meniscus due to old tear or injury, right knee: Secondary | ICD-10-CM | POA: Diagnosis not present

## 2016-12-20 DIAGNOSIS — M2241 Chondromalacia patellae, right knee: Secondary | ICD-10-CM | POA: Diagnosis not present

## 2016-12-20 DIAGNOSIS — G8918 Other acute postprocedural pain: Secondary | ICD-10-CM | POA: Diagnosis not present

## 2016-12-20 DIAGNOSIS — M94261 Chondromalacia, right knee: Secondary | ICD-10-CM | POA: Diagnosis not present

## 2017-03-29 DIAGNOSIS — Z719 Counseling, unspecified: Secondary | ICD-10-CM | POA: Diagnosis not present

## 2017-04-11 DIAGNOSIS — Z719 Counseling, unspecified: Secondary | ICD-10-CM | POA: Diagnosis not present

## 2017-04-18 DIAGNOSIS — Z719 Counseling, unspecified: Secondary | ICD-10-CM | POA: Diagnosis not present

## 2017-04-25 DIAGNOSIS — Z719 Counseling, unspecified: Secondary | ICD-10-CM | POA: Diagnosis not present

## 2017-05-02 DIAGNOSIS — Z719 Counseling, unspecified: Secondary | ICD-10-CM | POA: Diagnosis not present

## 2017-05-03 DIAGNOSIS — E291 Testicular hypofunction: Secondary | ICD-10-CM | POA: Diagnosis not present

## 2017-05-09 DIAGNOSIS — Z719 Counseling, unspecified: Secondary | ICD-10-CM | POA: Diagnosis not present

## 2017-05-16 DIAGNOSIS — Z719 Counseling, unspecified: Secondary | ICD-10-CM | POA: Diagnosis not present

## 2017-05-23 DIAGNOSIS — Z719 Counseling, unspecified: Secondary | ICD-10-CM | POA: Diagnosis not present

## 2017-05-30 DIAGNOSIS — Z719 Counseling, unspecified: Secondary | ICD-10-CM | POA: Diagnosis not present

## 2017-06-06 DIAGNOSIS — Z719 Counseling, unspecified: Secondary | ICD-10-CM | POA: Diagnosis not present

## 2017-06-08 DIAGNOSIS — E291 Testicular hypofunction: Secondary | ICD-10-CM | POA: Diagnosis not present

## 2017-06-08 DIAGNOSIS — R6882 Decreased libido: Secondary | ICD-10-CM | POA: Diagnosis not present

## 2017-06-08 DIAGNOSIS — Z72820 Sleep deprivation: Secondary | ICD-10-CM | POA: Diagnosis not present

## 2017-06-13 DIAGNOSIS — E291 Testicular hypofunction: Secondary | ICD-10-CM | POA: Diagnosis not present

## 2017-06-13 DIAGNOSIS — R6882 Decreased libido: Secondary | ICD-10-CM | POA: Diagnosis not present

## 2017-06-13 DIAGNOSIS — N529 Male erectile dysfunction, unspecified: Secondary | ICD-10-CM | POA: Diagnosis not present

## 2017-12-11 DIAGNOSIS — Z72820 Sleep deprivation: Secondary | ICD-10-CM | POA: Diagnosis not present

## 2017-12-11 DIAGNOSIS — E291 Testicular hypofunction: Secondary | ICD-10-CM | POA: Diagnosis not present

## 2017-12-11 DIAGNOSIS — R6882 Decreased libido: Secondary | ICD-10-CM | POA: Diagnosis not present

## 2017-12-13 DIAGNOSIS — E039 Hypothyroidism, unspecified: Secondary | ICD-10-CM | POA: Diagnosis not present

## 2017-12-13 DIAGNOSIS — N529 Male erectile dysfunction, unspecified: Secondary | ICD-10-CM | POA: Diagnosis not present

## 2017-12-13 DIAGNOSIS — E291 Testicular hypofunction: Secondary | ICD-10-CM | POA: Diagnosis not present

## 2018-01-09 ENCOUNTER — Ambulatory Visit: Payer: 59 | Admitting: Family Medicine

## 2018-01-10 ENCOUNTER — Encounter: Payer: Self-pay | Admitting: Family Medicine

## 2018-01-10 ENCOUNTER — Ambulatory Visit (INDEPENDENT_AMBULATORY_CARE_PROVIDER_SITE_OTHER): Payer: 59 | Admitting: Family Medicine

## 2018-01-10 VITALS — BP 128/82 | HR 65 | Temp 98.2°F | Ht 72.5 in | Wt 210.0 lb

## 2018-01-10 DIAGNOSIS — S39012A Strain of muscle, fascia and tendon of lower back, initial encounter: Secondary | ICD-10-CM | POA: Diagnosis not present

## 2018-01-10 MED ORDER — CYCLOBENZAPRINE HCL 5 MG PO TABS: 5.0000 mg | ORAL_TABLET | Freq: Three times a day (TID) | ORAL | 1 refills | Status: DC | PRN

## 2018-01-10 NOTE — Progress Notes (Signed)
SUBJECTIVE:  Louis Perkins is a 47 y.o. male who complains of low back pain for 1 week(s), positional with bending or lifting, without radiation down the legs. Precipitating factors: none recalled by the patient. Prior history of back problems: recurrent self limited episodes of low back pain in the past. There is no numbness in the legs. Has been using motrin and icy/hot without much improvement in symptoms.  Current Outpatient Medications on File Prior to Visit  Medication Sig Dispense Refill  . ARMOUR THYROID 30 MG tablet TAKE 1 TABLET BY MOUTH EVERY DAY ON EMPTY STOMACH  2   No current facility-administered medications on file prior to visit.     No Known Allergies  Past Medical History:  Diagnosis Date  . History of chicken pox     Past Surgical History:  Procedure Laterality Date  . ADENOIDECTOMY  1985  . BRAIN SURGERY    . KNEE ARTHROSCOPY W/ ACL RECONSTRUCTION Right 2006   Cadaver graft    Family History  Problem Relation Age of Onset  . Heart disease Father   . Cancer Maternal Uncle        prostate CA  . Arthritis Maternal Grandmother   . Hypertension Maternal Grandmother   . Cancer Cousin        breast CA    Social History   Socioeconomic History  . Marital status: Divorced    Spouse name: Not on file  . Number of children: Not on file  . Years of education: Not on file  . Highest education level: Not on file  Occupational History  . Not on file  Social Needs  . Financial resource strain: Not on file  . Food insecurity:    Worry: Not on file    Inability: Not on file  . Transportation needs:    Medical: Not on file    Non-medical: Not on file  Tobacco Use  . Smoking status: Never Smoker  . Smokeless tobacco: Never Used  Substance and Sexual Activity  . Alcohol use: Yes  . Drug use: No  . Sexual activity: Not on file  Lifestyle  . Physical activity:    Days per week: Not on file    Minutes per session: Not on file  . Stress: Not on file   Relationships  . Social connections:    Talks on phone: Not on file    Gets together: Not on file    Attends religious service: Not on file    Active member of club or organization: Not on file    Attends meetings of clubs or organizations: Not on file    Relationship status: Not on file  . Intimate partner violence:    Fear of current or ex partner: Not on file    Emotionally abused: Not on file    Physically abused: Not on file    Forced sexual activity: Not on file  Other Topics Concern  . Not on file  Social History Narrative  . Not on file   The PMH, PSH, Social History, Family History, Medications, and allergies have been reviewed in [facility], and have been updated if relevant.  OBJECTIVE: BP 128/82   Pulse 65   Temp 98.2 F (36.8 C) (Oral)   Ht 6' 0.5" (1.842 m)   Wt 210 lb (95.3 kg)   SpO2 98%   BMI 28.09 kg/m   Patient appears to be in mild to moderate pain, antalgic gait noted. Lumbosacral spine area reveals no local  tenderness or mass.  Painful and reduced LS ROM noted. Straight leg raise is minimal at 90 degrees on left. DTR's, motor strength and sensation normal, including heel and toe gait.  Peripheral pulses are palpable. X-Ray: not indicated.  ASSESSMENT:  lumbar strain  PLAN: For acute pain, rest, intermittent application of heat (do not sleep on heating pad), analgesics and muscle relaxants are recommended. Discussed longer term treatment plan of prn NSAID's and discussed a home back care exercise program with flexion exercise routine. Proper lifting with avoidance of heavy lifting discussed. Consider Physical Therapy and XRay studies if not improving. Call or return to clinic prn if these symptoms worsen or fail to improve as anticipated.

## 2018-01-10 NOTE — Patient Instructions (Signed)

## 2018-01-14 ENCOUNTER — Ambulatory Visit (INDEPENDENT_AMBULATORY_CARE_PROVIDER_SITE_OTHER): Payer: 59 | Admitting: Family Medicine

## 2018-01-14 ENCOUNTER — Encounter: Payer: Self-pay | Admitting: Family Medicine

## 2018-01-14 ENCOUNTER — Ambulatory Visit (INDEPENDENT_AMBULATORY_CARE_PROVIDER_SITE_OTHER): Payer: 59

## 2018-01-14 VITALS — BP 118/84 | HR 60 | Temp 98.3°F | Ht 72.5 in | Wt 211.4 lb

## 2018-01-14 DIAGNOSIS — M1711 Unilateral primary osteoarthritis, right knee: Secondary | ICD-10-CM

## 2018-01-14 NOTE — Progress Notes (Signed)
Louis Perkins - 47 y.o. male MRN [id number]  Date of birth: Jul 19, 1970  SUBJECTIVE:  Including CC & ROS.  Chief Complaint  Patient presents with  . Knee Pain    pt c/o right knee pain intermittently , has had 2 main surgeries ACL (2007) , muniscus (12/2016) , pt wants to discuss getting custom brace    Louis Perkins is a 47 y.o. male that is presenting with right knee pain.  He reports the pain is intermittent in nature.  Localized to the knee.  Mild to moderate in severity.  Denies any specific inciting event.  Pain is worse with performing taekwondo.  He feels unstable when he is performing certain kicks.  The pain is generalized.  He denies any recent injuries.  He had surgery last year for the meniscus.  He has been using a DonJoy brace but feels it does not help that much..  Independent review of the right knee x-ray from 2018 shows a previous ACL repair on the right with mild medial joint space narrowing  Review of the MRI of the right knee from 11/23/2016 shows radial tearing of the medial meniscus body and posterior horn with a bucket-handle tear and a flipped meniscus fragment.  She has an intact ACL graft.  Shows mild tricompartmental degenerative changes.  Review of Systems  Constitutional: Negative for fever.  HENT: Negative for congestion.   Respiratory: Negative for cough.   Cardiovascular: Negative for chest pain.  Gastrointestinal: Negative for abdominal pain.  Musculoskeletal: Positive for joint swelling.  Skin: Negative for color change.  Neurological: Negative for weakness.  Hematological: Negative for adenopathy.  Psychiatric/Behavioral: Negative for agitation.    HISTORY: Past Medical, Surgical, Social, and Family History Reviewed & Updated per EMR.   Pertinent Historical Findings include:  Past Medical History:  Diagnosis Date  . History of chicken pox     Past Surgical History:  Procedure Laterality Date  . ADENOIDECTOMY  1985  . BRAIN SURGERY    .  KNEE ARTHROSCOPY W/ ACL RECONSTRUCTION Right 2006   Cadaver graft    No Known Allergies  Family History  Problem Relation Age of Onset  . Heart disease Father   . Cancer Maternal Uncle        prostate CA  . Arthritis Maternal Grandmother   . Hypertension Maternal Grandmother   . Cancer Cousin        breast CA     Social History   Socioeconomic History  . Marital status: Divorced    Spouse name: Not on file  . Number of children: Not on file  . Years of education: Not on file  . Highest education level: Not on file  Occupational History  . Not on file  Social Needs  . Financial resource strain: Not on file  . Food insecurity:    Worry: Not on file    Inability: Not on file  . Transportation needs:    Medical: Not on file    Non-medical: Not on file  Tobacco Use  . Smoking status: Never Smoker  . Smokeless tobacco: Never Used  Substance and Sexual Activity  . Alcohol use: Yes  . Drug use: No  . Sexual activity: Not on file  Lifestyle  . Physical activity:    Days per week: Not on file    Minutes per session: Not on file  . Stress: Not on file  Relationships  . Social connections:    Talks on phone: Not on file  Gets together: Not on file    Attends religious service: Not on file    Active member of club or organization: Not on file    Attends meetings of clubs or organizations: Not on file    Relationship status: Not on file  . Intimate partner violence:    Fear of current or ex partner: Not on file    Emotionally abused: Not on file    Physically abused: Not on file    Forced sexual activity: Not on file  Other Topics Concern  . Not on file  Social History Narrative  . Not on file     PHYSICAL EXAM:  VS: BP 118/84 (BP Location: Right Arm, Patient Position: Sitting, Cuff Size: Large)   Pulse 60   Temp 98.3 F (36.8 C) (Oral)   Ht 6' 0.5" (1.842 m)   Wt 211 lb 6.4 oz (95.9 kg)   SpO2 99%   BMI 28.28 kg/m  Physical Exam Gen: NAD, alert,  cooperative with exam, well-appearing ENT: normal lips, normal nasal mucosa,  Eye: normal EOM, normal conjunctiva and lids CV:  no edema, +2 pedal pulses   Resp: no accessory muscle use, non-labored,  Skin: no rashes, no areas of induration  Neuro: normal tone, normal sensation to touch Psych:  normal insight, alert and oriented MSK:  Right knee:  No obvious effusion  Normal ROM  Normal strength to resistance  Instability with valgus testing  Negative McMurray's testing Neurovascular intact    ASSESSMENT & PLAN:   Primary osteoarthritis of right knee Has had multiple surgeries on his right knee.  He feels unsteady and unstable when he is performing taekwondo.  No recent injuries. -Has instability and would recommend a medial unloader brace -Counseled on supportive care. -Discussed different injection options.  Could consider steroid, gel, or PRP. -X-ray today

## 2018-01-14 NOTE — Assessment & Plan Note (Signed)
Has had multiple surgeries on his right knee.  He feels unsteady and unstable when he is performing taekwondo.  No recent injuries. -Has instability and would recommend a medial unloader brace -Counseled on supportive care. -Discussed different injection options.  Could consider steroid, gel, or PRP. -X-ray today

## 2018-01-14 NOTE — Patient Instructions (Signed)
Nice to meet you  The [employer] rep will give you a call.  Please let me know if you would like to try anything else for your pain.

## 2018-01-15 ENCOUNTER — Telehealth: Payer: Self-pay | Admitting: Family Medicine

## 2018-01-15 MED ORDER — DICLOFENAC SODIUM 2 % TD SOLN: 1.0000 | Freq: Two times a day (BID) | TRANSDERMAL | 3 refills | Status: DC

## 2018-01-15 NOTE — Telephone Encounter (Signed)
Informed patient of results. Will send in pennsaid.   Rosemarie Ax, MD [facility] 01/15/2018, 9:58 AM

## 2018-02-01 DIAGNOSIS — M1711 Unilateral primary osteoarthritis, right knee: Secondary | ICD-10-CM | POA: Diagnosis not present

## 2018-03-15 DIAGNOSIS — E291 Testicular hypofunction: Secondary | ICD-10-CM | POA: Diagnosis not present

## 2018-03-15 DIAGNOSIS — Z72820 Sleep deprivation: Secondary | ICD-10-CM | POA: Diagnosis not present

## 2018-03-15 DIAGNOSIS — R6882 Decreased libido: Secondary | ICD-10-CM | POA: Diagnosis not present

## 2018-03-19 DIAGNOSIS — R6882 Decreased libido: Secondary | ICD-10-CM | POA: Diagnosis not present

## 2018-03-19 DIAGNOSIS — Z72820 Sleep deprivation: Secondary | ICD-10-CM | POA: Diagnosis not present

## 2018-03-19 DIAGNOSIS — E291 Testicular hypofunction: Secondary | ICD-10-CM | POA: Diagnosis not present

## 2018-04-11 DIAGNOSIS — Z719 Counseling, unspecified: Secondary | ICD-10-CM | POA: Diagnosis not present

## 2018-04-16 DIAGNOSIS — Z719 Counseling, unspecified: Secondary | ICD-10-CM | POA: Diagnosis not present

## 2018-04-24 ENCOUNTER — Ambulatory Visit: Payer: 59 | Admitting: Family Medicine

## 2018-04-25 ENCOUNTER — Ambulatory Visit: Payer: 59 | Admitting: Family Medicine

## 2018-04-25 DIAGNOSIS — Z719 Counseling, unspecified: Secondary | ICD-10-CM | POA: Diagnosis not present

## 2018-04-29 ENCOUNTER — Ambulatory Visit (INDEPENDENT_AMBULATORY_CARE_PROVIDER_SITE_OTHER): Payer: 59

## 2018-04-29 ENCOUNTER — Encounter: Payer: Self-pay | Admitting: Family Medicine

## 2018-04-29 ENCOUNTER — Ambulatory Visit (INDEPENDENT_AMBULATORY_CARE_PROVIDER_SITE_OTHER): Payer: 59 | Admitting: Family Medicine

## 2018-04-29 VITALS — BP 126/88 | HR 60 | Temp 98.9°F | Ht 73.0 in | Wt 211.6 lb

## 2018-04-29 DIAGNOSIS — M5416 Radiculopathy, lumbar region: Secondary | ICD-10-CM

## 2018-04-29 DIAGNOSIS — M545 Low back pain: Secondary | ICD-10-CM | POA: Diagnosis not present

## 2018-04-29 MED ORDER — METHOCARBAMOL 500 MG PO TABS: 500.0000 mg | ORAL_TABLET | Freq: Three times a day (TID) | ORAL | 3 refills | Status: DC | PRN

## 2018-04-29 MED ORDER — PREDNISONE 10 MG PO TABS: ORAL_TABLET | ORAL | 0 refills | Status: DC

## 2018-04-29 NOTE — Patient Instructions (Signed)
Great to see you. I will call you the your xray results.  Take prednisone as directed- in the morning and with food- hold off on ibuprofen while taking it. Robaxin as needed as a muscle relaxant (can make you sleepy).  Continue exercises.  Keep me updated.

## 2018-04-29 NOTE — Progress Notes (Signed)
SUBJECTIVE:  Louis Perkins is a 48 y.o. male who complains of intermittent lumbar pain since I last saw him on 01/10/18 for this. Note reviewed from 01/10/18- at that time, he had one week of complaints.  Motrin, icy hot and flexeril at bedtime.  2-3 episodes since that time.    Most recently another episode 8 days ago- pain and radiculopathy down right hip. No weakness.  Dull constant pain with those sharp shooting pains.  Flexeril has not helped much this time.  He has been doing the home exercises I gave him.  Current Outpatient Medications on File Prior to Visit  Medication Sig Dispense Refill  . ARMOUR THYROID 30 MG tablet TAKE 1 TABLET BY MOUTH EVERY DAY ON EMPTY STOMACH  2  . cyclobenzaprine (FLEXERIL) 5 MG tablet Take 1 tablet (5 mg total) by mouth 3 (three) times daily as needed for muscle spasms. 30 tablet 1  . Diclofenac Sodium (PENNSAID) 2 % SOLN Place 1 application onto the skin 2 (two) times daily. 1 Bottle 3   No current facility-administered medications on file prior to visit.     No Known Allergies  Past Medical History:  Diagnosis Date  . History of chicken pox     Past Surgical History:  Procedure Laterality Date  . ADENOIDECTOMY  1985  . BRAIN SURGERY    . KNEE ARTHROSCOPY W/ ACL RECONSTRUCTION Right 2006   Cadaver graft    Family History  Problem Relation Age of Onset  . Heart disease Father   . Cancer Maternal Uncle        prostate CA  . Arthritis Maternal Grandmother   . Hypertension Maternal Grandmother   . Cancer Cousin        breast CA    Social History   Socioeconomic History  . Marital status: Divorced    Spouse name: Not on file  . Number of children: Not on file  . Years of education: Not on file  . Highest education level: Not on file  Occupational History  . Not on file  Social Needs  . Financial resource strain: Not on file  . Food insecurity:    Worry: Not on file    Inability: Not on file  . Transportation needs:   Medical: Not on file    Non-medical: Not on file  Tobacco Use  . Smoking status: Never Smoker  . Smokeless tobacco: Never Used  Substance and Sexual Activity  . Alcohol use: Yes  . Drug use: No  . Sexual activity: Not on file  Lifestyle  . Physical activity:    Days per week: Not on file    Minutes per session: Not on file  . Stress: Not on file  Relationships  . Social connections:    Talks on phone: Not on file    Gets together: Not on file    Attends religious service: Not on file    Active member of club or organization: Not on file    Attends meetings of clubs or organizations: Not on file    Relationship status: Not on file  . Intimate partner violence:    Fear of current or ex partner: Not on file    Emotionally abused: Not on file    Physically abused: Not on file    Forced sexual activity: Not on file  Other Topics Concern  . Not on file  Social History Narrative  . Not on file   The PMH, [place], Social History, Family  History, Medications, and allergies have been reviewed in [facility], and have been updated if relevant.   OBJECTIVE: BP 126/88 (BP Location: Left Arm, Patient Position: Sitting, Cuff Size: Normal)   Pulse 60   Temp 98.9 F (37.2 C) (Oral)   Ht 6\' 1"  (1.854 m)   Wt 211 lb 9.6 oz (96 kg)   SpO2 98%   BMI 27.92 kg/m   Patient appears to be in mild to moderate pain, antalgic gait noted. Lumbosacral spine area reveals no local tenderness or mass.  Painful and reduced LS ROM noted. Straight leg raise is positive at 45 degrees on bilateral. DTR's, motor strength and sensation normal, including heel and toe gait.  Peripheral pulses are palpable. X-Ray: ordered, but results not yet available.  ASSESSMENT:  lumbar strain and with radiculopathy  PLAN: Given multiple reocurrances, xray today. For acute pain, rest, intermittent application of heat (do not sleep on heating pad), analgesics and muscle relaxants are recommended. Also prescribed a steroid burst.   Continue exercises.  He declines PT at this time.  Call or return to clinic prn if these symptoms worsen or fail to improve as anticipated. The patient indicates understanding of these issues and agrees with the plan.

## 2018-05-01 ENCOUNTER — Encounter: Payer: Self-pay | Admitting: Family Medicine

## 2018-05-01 ENCOUNTER — Other Ambulatory Visit: Payer: Self-pay | Admitting: Family Medicine

## 2018-05-01 DIAGNOSIS — M5126 Other intervertebral disc displacement, lumbar region: Secondary | ICD-10-CM

## 2018-05-07 DIAGNOSIS — Z719 Counseling, unspecified: Secondary | ICD-10-CM | POA: Diagnosis not present

## 2018-05-14 DIAGNOSIS — Z719 Counseling, unspecified: Secondary | ICD-10-CM | POA: Diagnosis not present

## 2018-05-15 ENCOUNTER — Other Ambulatory Visit: Payer: Self-pay | Admitting: Otolaryngology

## 2018-05-15 DIAGNOSIS — D333 Benign neoplasm of cranial nerves: Secondary | ICD-10-CM

## 2018-05-15 DIAGNOSIS — Z86011 Personal history of benign neoplasm of the brain: Secondary | ICD-10-CM

## 2018-05-21 DIAGNOSIS — Z719 Counseling, unspecified: Secondary | ICD-10-CM | POA: Diagnosis not present

## 2018-05-28 DIAGNOSIS — Z719 Counseling, unspecified: Secondary | ICD-10-CM | POA: Diagnosis not present

## 2018-07-08 ENCOUNTER — Ambulatory Visit
Admission: RE | Admit: 2018-07-08 | Discharge: 2018-07-08 | Disposition: A | Discharge status: Home / Self Care | Payer: 59 | Source: Ambulatory Visit | Attending: Otolaryngology | Admitting: Otolaryngology

## 2018-07-08 ENCOUNTER — Other Ambulatory Visit: Payer: Self-pay

## 2018-07-08 DIAGNOSIS — Z86011 Personal history of benign neoplasm of the brain: Secondary | ICD-10-CM

## 2018-07-08 DIAGNOSIS — D333 Benign neoplasm of cranial nerves: Secondary | ICD-10-CM

## 2018-07-08 MED ORDER — GADOBENATE DIMEGLUMINE 529 MG/ML IV SOLN
20.0000 mL | Freq: Once | INTRAVENOUS | Status: AC | PRN
  Administered 2018-07-08: 12:00:00 20 mL via INTRAVENOUS

## 2019-03-30 IMAGING — DX DG KNEE COMPLETE 4+V*R*
4 series · 4 of 4 positions shown · non-contrast
Comparison: None.

CLINICAL DATA: Right knee pain for 3 months. Pain and swelling with
exercise.

EXAM:
RIGHT KNEE - COMPLETE 4+ VIEW

[knee ap (1 of 2)]
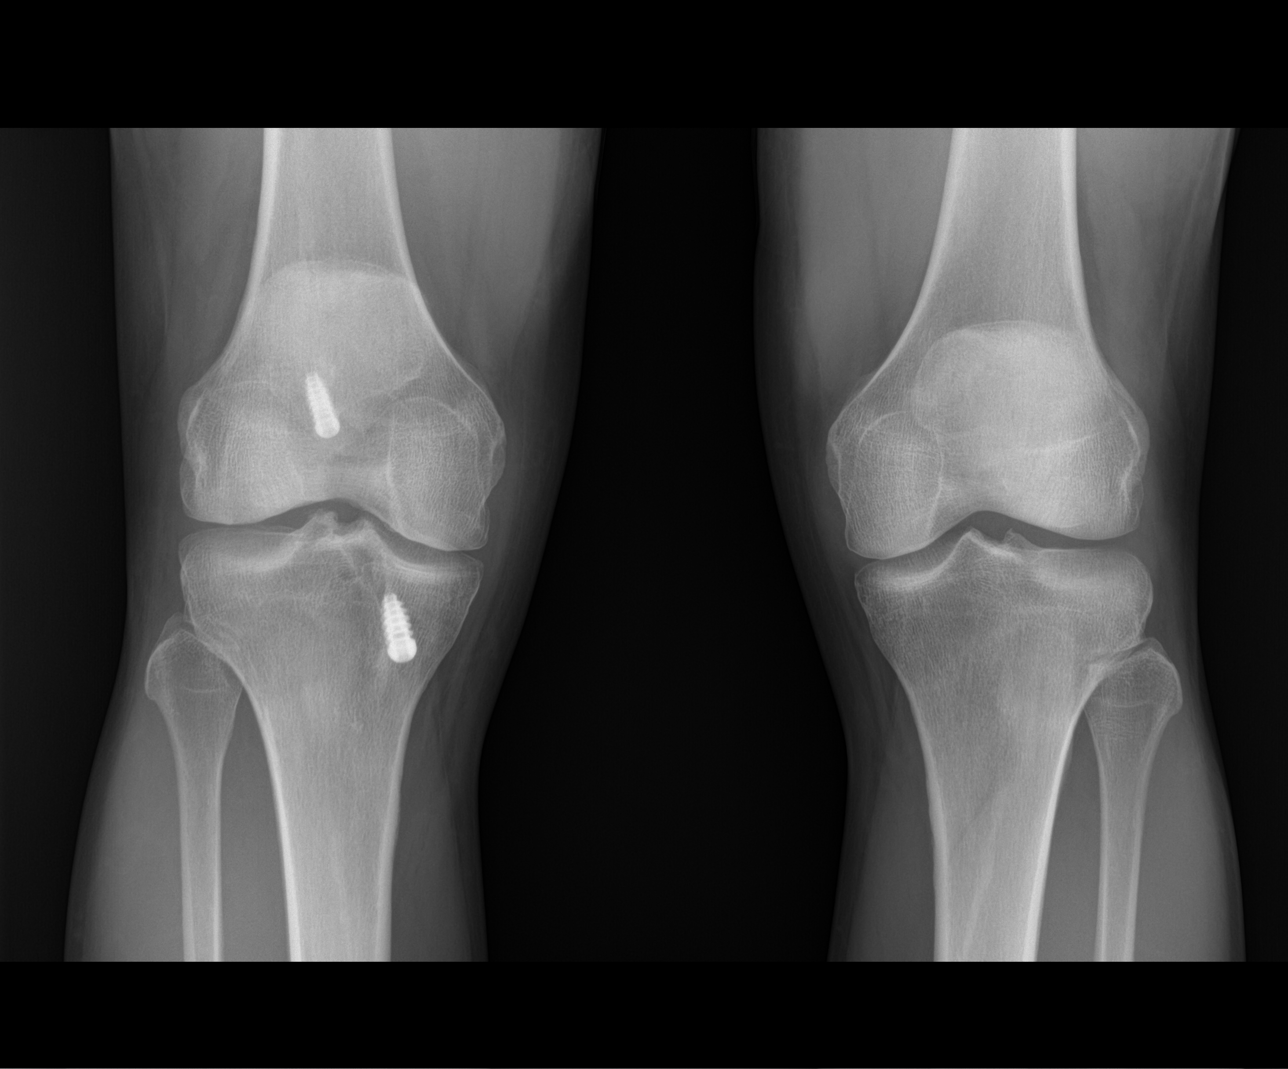

[knee lat]
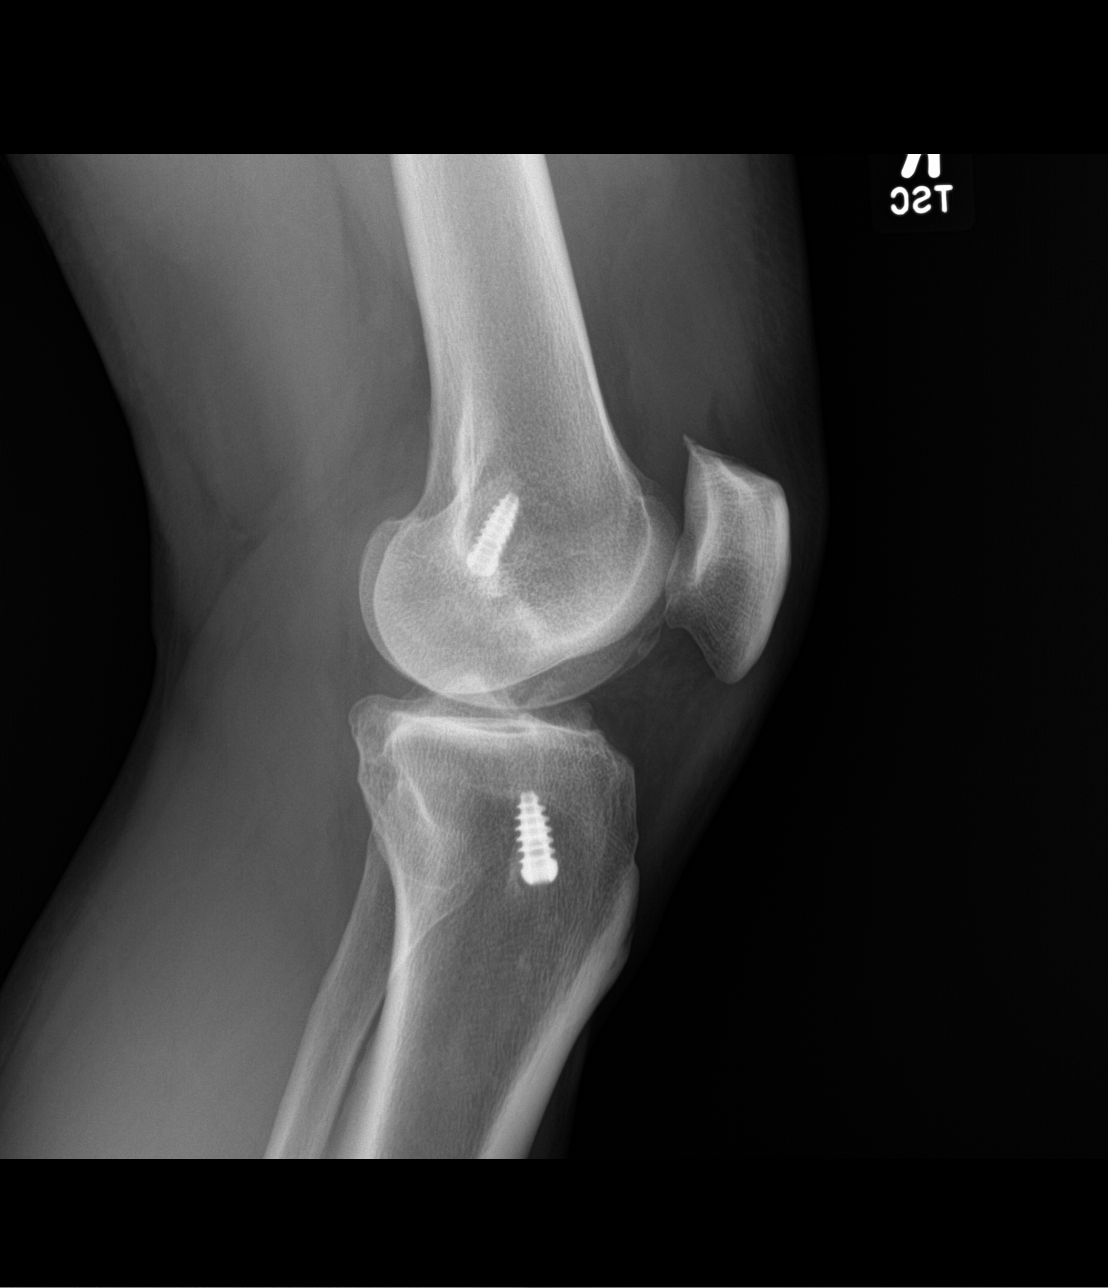

[patella skyline]
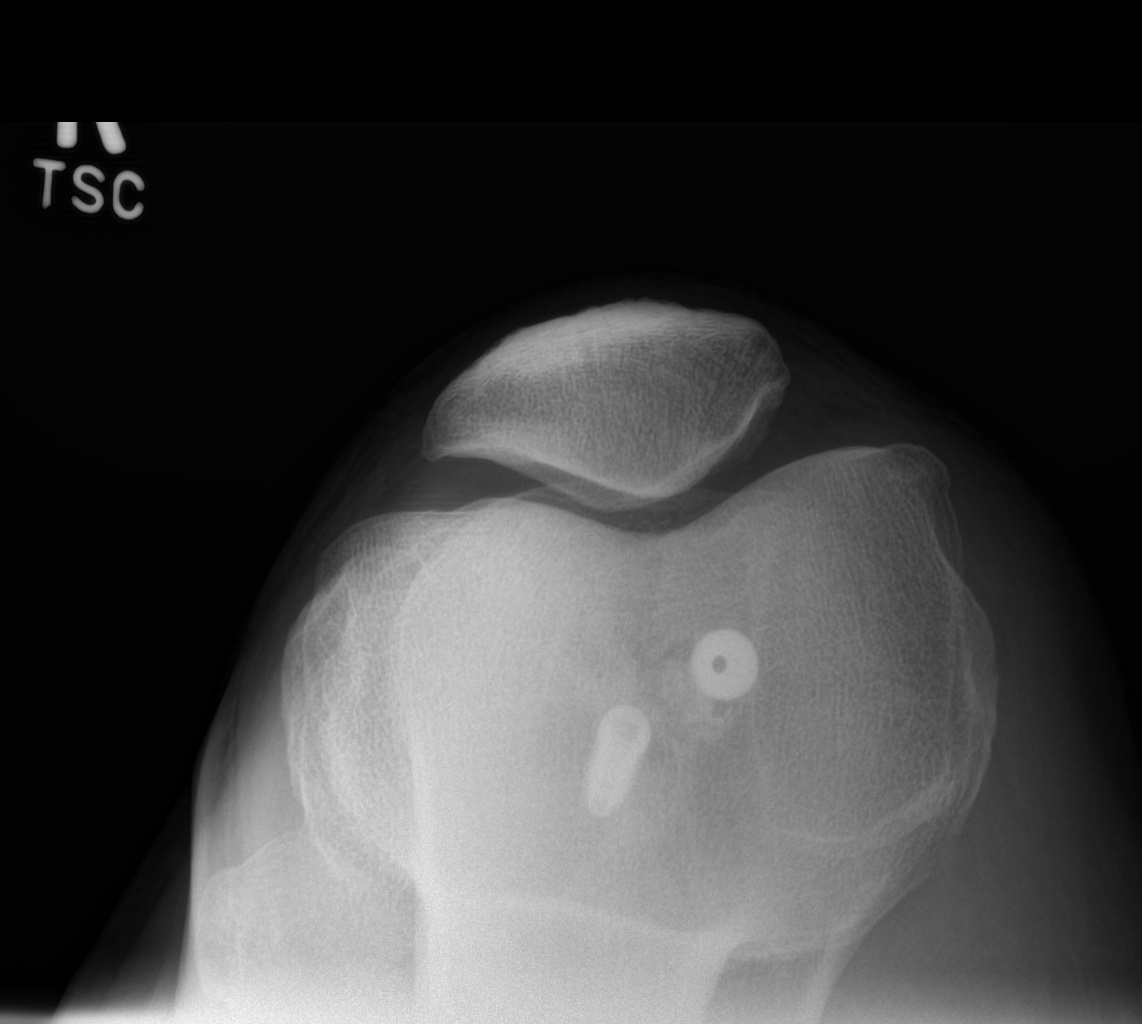

[knee ap (2 of 2)]
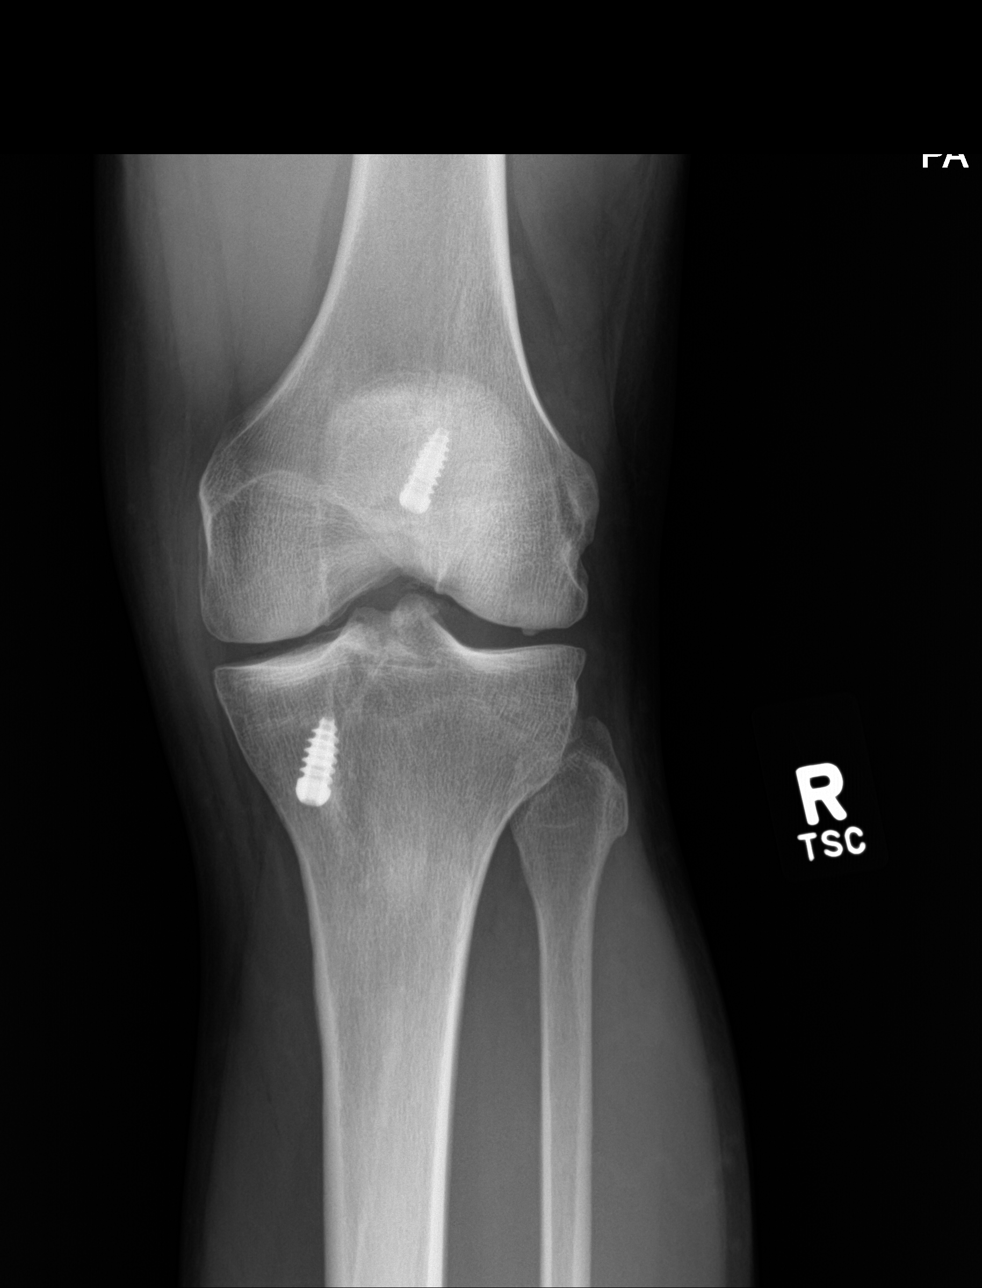

[4 of 4 positions shown; findings below may reference images not displayed]

FINDINGS: Bones: No fracture or dislocation. Normal bone mineralization. Prior
right ACL repair with interference screws in satisfactory position.

Joints: Normal alignment. No erosive changes. Mild right lateral
femorotibial compartment joint space narrowing. No significant
medial femorotibial compartment joint space narrowing. Mild
osteoarthritis of the right patellofemoral compartment. Moderate
right knee joint effusion.

Soft tissue: No soft tissue abnormality. No radiopaque foreign body.
No subcutaneous emphysema.
IMPRESSION: 1. Mild lateral femorotibial compartment and patellofemoral
compartment osteoarthritis. Moderate right knee joint effusion.

## 2019-05-22 ENCOUNTER — Ambulatory Visit: Payer: 59 | Attending: Internal Medicine

## 2019-05-22 DIAGNOSIS — Z23 Encounter for immunization: Secondary | ICD-10-CM

## 2019-05-22 NOTE — Progress Notes (Signed)
   Covid-19 Vaccination Clinic  Name:  Louis Perkins    MRN: [id number] DOB: [birth year]  05/22/2019  [name] was observed post Covid-19 immunization for 15 minutes without incident. He was provided with Vaccine Information Sheet and instruction to access the V-Safe system.   [name] was instructed to call 911 with any severe reactions post vaccine: Marland Kitchen Difficulty breathing  . Swelling of face and throat  . A fast heartbeat  . A bad rash all over body  . Dizziness and weakness   Immunizations Administered    Name Date Dose VIS Date Route   Pfizer COVID-19 Vaccine 05/22/2019 11:18 AM 0.3 mL 02/21/2019 Intramuscular   Manufacturer: [place]   Lot: [id number]   [place]: [id number]

## 2019-06-16 ENCOUNTER — Ambulatory Visit: Payer: 59 | Attending: Internal Medicine

## 2019-06-16 DIAGNOSIS — Z23 Encounter for immunization: Secondary | ICD-10-CM

## 2019-06-16 NOTE — Progress Notes (Signed)
   Covid-19 Vaccination Clinic  Name:  Louis Perkins    MRN: [id number] DOB: 1970-07-28  06/16/2019  Mr. Hillers was observed post Covid-19 immunization for 15 minutes without incident. He was provided with Vaccine Information Sheet and instruction to access the V-Safe system.   Mr. Miera was instructed to call 911 with any severe reactions post vaccine: Marland Kitchen Difficulty breathing  . Swelling of face and throat  . A fast heartbeat  . A bad rash all over body  . Dizziness and weakness   Immunizations Administered    Name Date Dose VIS Date Route   Pfizer COVID-19 Vaccine 06/16/2019  9:52 AM 0.3 mL 02/21/2019 Intramuscular   Manufacturer: [place]   Lot: [id number]   [place]: [id number]

## 2019-12-15 ENCOUNTER — Ambulatory Visit: Payer: 59 | Admitting: Family Medicine

## 2019-12-18 ENCOUNTER — Ambulatory Visit: Payer: 59 | Admitting: Family Medicine

## 2019-12-29 ENCOUNTER — Ambulatory Visit: Payer: 59 | Admitting: Family Medicine

## 2019-12-29 ENCOUNTER — Telehealth: Payer: Self-pay

## 2019-12-29 NOTE — Progress Notes (Deleted)
    Elicia Lui T. Stefan Karen, MD, [place] at [facility] [place], [address]  Phone: [phone]  FAX: [phone]  BARUCH LEWERS - 49 y.o. male  MRN [id number]  Date of Birth: 1970-10-05  Date: 12/29/2019  PCP: No primary care provider on file.  Referral: No ref. provider found  No chief complaint on file.   This visit occurred during the SARS-CoV-2 public health emergency.  Safety protocols were in place, including screening questions prior to the visit, additional usage of staff PPE, and extensive cleaning of exam room while observing appropriate contact time as indicated for disinfecting solutions.   Subjective:   MARTEN ILES is a 49 y.o. very pleasant male patient with There is no height or weight on file to calculate BMI. who presents with the following:  Former patient of Dr. Deborra Medina who I saw previously for knee pain who presents today with some L sided shoulder pain:    Review of Systems is noted in the HPI, as appropriate   Objective:   There were no vitals taken for this visit.  ***  Radiology: No results found.  Assessment and Plan:   ***

## 2019-12-29 NOTE — Telephone Encounter (Signed)
[place] Night - Client Nonclinical Telephone Record Technical brewer Primary Care [facility] Night - Client Client Site [place] Physician Owens Loffler - MD Contact Type Call Who Is Calling Patient / Member / Family / Caregiver Caller Name Quentavious Rittenhouse Caller Phone Number [phone] Patient Name Louis Perkins Patient DOB [birth year] Call Type Message Only Information Provided Reason for Call Request to Reschedule Office Appointment Initial Comment Caller states he wants to reschedule his 830am appointment. Additional Comment Please call. Disp. Time Disposition Final User 12/29/2019 3:30:34 AM General Information Provided Yes Clydene Laming, Amy Call Closed By: [place] Transaction Date/Time: 12/29/2019 3:28:35 AM (ET)

## 2019-12-30 NOTE — Telephone Encounter (Signed)
10/25 pt aware

## 2020-01-04 NOTE — Progress Notes (Signed)
Anastasio Wogan T. Calla Wedekind, MD, [place] at [facility] : [facility] [place], [address]  Phone: [phone]  FAX: [phone]  Louis Perkins - 49 y.o. male  MRN [id number]  Date of Birth: 01/29/1971  Date: 01/05/2020  PCP: Owens Loffler, MD  Referral: No ref. provider found  Chief Complaint  Patient presents with  . Shoulder Pain    Left    This visit occurred during the SARS-CoV-2 public health emergency.  Safety protocols were in place, including screening questions prior to the visit, additional usage of staff PPE, and extensive cleaning of exam room while observing appropriate contact time as indicated for disinfecting solutions.   Subjective:   Louis Perkins is a 49 y.o. very pleasant male patient with Body mass index is 29.76 kg/m. who presents with the following:  Not seen in > 3 years, this is a new patient appointment.  I have seen this pleasant gentleman before for previous knee injury, and I once saw him for his some shoulder issues about 7 years ago.  He is here to follow-up today on some left-sided shoulder pain.  He does not describe any specific trauma.  No prior operative intervention in the affected shoulder.  He is a very physically fit gentleman, and he is an active athlete.  He does have some pain with abduction and reaching across with the shoulder.  He is still active in martial arts.  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  On the right side he does have a known disc herniation and he also have some intermittent radiculopathy.  This is in the lumbar region.  He was having this intermittently, he still have this intermittently 2 or 3 times a year.  Previously saw my partner Dr. Deborra Medina, and she did refer him to neurosurgery.  This was during the Covid pandemic, so he decided but not keep this appointment.  At  this time he is not having any numbness, weakness, saddle anesthesia.  Review of Systems is noted in the HPI, as appropriate   Objective:   BP 120/90   Pulse 62   Temp 98.3 F (36.8 C) (Temporal)   Ht 6' 0.5" (1.842 m)   Wt 222 lb 8 oz (100.9 kg)   SpO2 97%   BMI 29.76 kg/m    GEN: No acute distress; alert,appropriate. PULM: Breathing comfortably in no respiratory distress PSYCH: Normally interactive.   Shoulder: Left Inspection: No muscle wasting or winging Ecchymosis/edema: neg  AC joint, scapula, clavicle: NT Does have some mild tenderness in the bicipital groove Cervical spine: NT, full ROM Spurling's: neg Abduction: full, 5/5, he does have a painful arc of motion Flexion: full, 5/5 IR, full, lift-off: 5/5 ER at neutral: full, 5/5 AC crossover and compression: neg Neer: Positive  Hawkins: Positive Drop Test: neg Empty Can: neg Supraspinatus insertion: NT His Jobe test is positive Speed's: neg Yergason's: neg Sulcus sign: neg Scapular dyskinesis: none C5-T1 intact Sensation intact Grip 5/5   Lumbar spine: Nontender throughout all spinous processes, nontender at the erector spinae.  Nontender in the posterior pelvis.  GTB is nontender.  He is neurovascularly intact throughout the entirety of the lower extremities.  Radiology: No results found.   Assessment and Plan:     ICD-10-CM   1. Tendinopathy of rotator cuff, left  M67.912   2. Acute pain of left shoulder  M25.512   3. Lumbar radiculopathy, acute  M54.16    New onset rotator cuff tendinopathy.  I reviewed throwers 12 program with him, and he is a very strong athlete and I think that he will do this okay on his own.  Intermittent lumbar radiculopathy with some degenerative disc disease of the lumbar spine with exacerbation.  At this point he is not having any neural compromise.  I am going to give him some steroids to take.  If his symptoms persist then getting an MRI of his lumbar spine would be the  next step with consideration interventional spine consultation.  Meds ordered this encounter  Medications  . predniSONE (DELTASONE) 20 MG tablet    Sig: 2 tabs po daily for 5 days, then 1 tab po daily for 5 days    Dispense:  15 tablet    Refill:  0   Medications Discontinued During This Encounter  Medication Reason  . predniSONE (DELTASONE) 10 MG tablet Completed Course  . methocarbamol (ROBAXIN) 500 MG tablet Completed Course   No orders of the defined types were placed in this encounter.   Follow-up: No follow-ups on file.  Signed,  Maud Deed. Aaden Buckman, MD   Outpatient Encounter Medications as of 01/05/2020  Medication Sig  . ARMOUR THYROID 15 MG tablet Take 15 mg by mouth daily.  Francia Greaves THYROID 30 MG tablet TAKE 1 TABLET BY MOUTH EVERY DAY ON EMPTY STOMACH  . Diclofenac Sodium (PENNSAID) 2 % SOLN Place 1 application onto the skin 2 (two) times daily.  . predniSONE (DELTASONE) 20 MG tablet 2 tabs po daily for 5 days, then 1 tab po daily for 5 days  . [DISCONTINUED] methocarbamol (ROBAXIN) 500 MG tablet Take 1 tablet (500 mg total) by mouth every 8 (eight) hours as needed for muscle spasms.  . [DISCONTINUED] predniSONE (DELTASONE) 10 MG tablet 3 tabs by mouth x 3 days, 2 tabs by mouth x 2 days, 1 tab by mouth x 2 days and stop.   No facility-administered encounter medications on file as of 01/05/2020.

## 2020-01-05 ENCOUNTER — Ambulatory Visit (INDEPENDENT_AMBULATORY_CARE_PROVIDER_SITE_OTHER): Payer: 59 | Admitting: Family Medicine

## 2020-01-05 ENCOUNTER — Other Ambulatory Visit: Payer: Self-pay

## 2020-01-05 ENCOUNTER — Encounter: Payer: Self-pay | Admitting: Family Medicine

## 2020-01-05 VITALS — BP 120/90 | HR 62 | Temp 98.3°F | Ht 72.5 in | Wt 222.5 lb

## 2020-01-05 DIAGNOSIS — M5416 Radiculopathy, lumbar region: Secondary | ICD-10-CM

## 2020-01-05 DIAGNOSIS — M67912 Unspecified disorder of synovium and tendon, left shoulder: Secondary | ICD-10-CM

## 2020-01-05 DIAGNOSIS — M25512 Pain in left shoulder: Secondary | ICD-10-CM

## 2020-01-05 MED ORDER — PREDNISONE 20 MG PO TABS: ORAL_TABLET | ORAL | 0 refills | Status: DC

## 2020-02-19 ENCOUNTER — Ambulatory Visit (INDEPENDENT_AMBULATORY_CARE_PROVIDER_SITE_OTHER): Payer: 59 | Admitting: Internal Medicine

## 2020-02-19 ENCOUNTER — Other Ambulatory Visit: Payer: Self-pay

## 2020-02-19 VITALS — BP 118/78 | HR 61 | Temp 98.0°F | Ht 73.0 in | Wt 222.9 lb

## 2020-02-19 DIAGNOSIS — M5136 Other intervertebral disc degeneration, lumbar region: Secondary | ICD-10-CM

## 2020-02-19 DIAGNOSIS — G4700 Insomnia, unspecified: Secondary | ICD-10-CM | POA: Insufficient documentation

## 2020-02-19 DIAGNOSIS — E039 Hypothyroidism, unspecified: Secondary | ICD-10-CM | POA: Insufficient documentation

## 2020-02-19 DIAGNOSIS — Z1211 Encounter for screening for malignant neoplasm of colon: Secondary | ICD-10-CM | POA: Diagnosis not present

## 2020-02-19 MED ORDER — PREDNISONE 10 MG (21) PO TBPK: ORAL_TABLET | ORAL | 0 refills | Status: DC

## 2020-02-19 MED ORDER — METHYLPREDNISOLONE ACETATE 80 MG/ML IJ SUSP
80.0000 mg | Freq: Once | INTRAMUSCULAR | Status: AC
  Administered 2020-02-19: 80 mg via INTRAMUSCULAR

## 2020-02-19 MED ORDER — CYCLOBENZAPRINE HCL 5 MG PO TABS: 5.0000 mg | ORAL_TABLET | Freq: Every evening | ORAL | 1 refills | Status: DC | PRN

## 2020-02-19 NOTE — Addendum Note (Signed)
Addended by: Marrion Coy on: 02/19/2020 09:52 AM   Modules accepted: Orders

## 2020-02-19 NOTE — Patient Instructions (Signed)
-  Nice seeing you today!!  -Lab work today; will notify you once results are available.  -Prednisone take as directed on insert for 6 days.  -PT will be scheduled.  -Schedule follow up in 3 months for your physical. Please come in fasting that day.

## 2020-02-19 NOTE — Addendum Note (Signed)
Addended by: Westley Hummer B on: 02/19/2020 09:56 AM   Modules accepted: Orders

## 2020-02-19 NOTE — Progress Notes (Signed)
Established Patient Office Visit     This visit occurred during the SARS-CoV-2 public health emergency.  Safety protocols were in place, including screening questions prior to the visit, additional usage of staff PPE, and extensive cleaning of exam room while observing appropriate contact time as indicated for disinfecting solutions.    CC/Reason for Visit: Establish care, discuss chronic conditions and some acute concerns.  HPI: Louis Perkins is a 49 y.o. male who is coming in today for the above mentioned reasons. Past Medical History is significant for: Right-sided acoustic neuroma that has been surgically removed and has left him with complete right-sided deafness.  He also has a history of recently diagnosed hypothyroidism in the last year currently treated with Armour Thyroid.  He has been diagnosed with L4-5 degenerative disc disease.  He always has chronic lower back pain but recently this has increased to right-sided radiculopathy with pain and numbness around the back and side of the thigh, sometimes it will radiate into the front and side of his toes.  He recently saw Dr. Edilia Bo for some shoulder issues and at that time was given a prednisone taper which he has now completed.  Pain was improved while on prednisone but has now worsened again.  He works third shift.  He has chronic insomnia.  He has no issues falling asleep but wakes up 4 to 5 hours later and is unable to fall back to sleep.    He does not think he snores at night, he does not wake up in the middle of the night gasping for air.  He does however have excessive daytime somnolence and fatigue. He has never had a colonoscopy.  He has not had a physical exam this year.  He has had all 3 Covid vaccines and his flu vaccine, he is due for Tdap update.  He does not smoke, he drinks alcohol only occasionally.  He has had 2 right knee surgeries in the past for torn meniscus as well as an ACL tear.  His father had coronary artery  disease.   Past Medical/Surgical History: Past Medical History:  Diagnosis Date  . History of chicken pox     Past Surgical History:  Procedure Laterality Date  . ADENOIDECTOMY  1985  . BRAIN SURGERY    . KNEE ARTHROSCOPY W/ ACL RECONSTRUCTION Right 2006   Cadaver graft    Social History:  reports that he has never smoked. He has never used smokeless tobacco. He reports current alcohol use. He reports that he does not use drugs.  Allergies: No Known Allergies  Family History:  Family History  Problem Relation Age of Onset  . Heart disease Father   . Cancer Maternal Uncle        prostate CA  . Arthritis Maternal Grandmother   . Hypertension Maternal Grandmother   . Cancer Cousin        breast CA     Current Outpatient Medications:  .  ARMOUR THYROID 15 MG tablet, Take 15 mg by mouth daily., Disp: , Rfl:  .  ARMOUR THYROID 30 MG tablet, TAKE 1 TABLET BY MOUTH EVERY DAY ON EMPTY STOMACH, Disp: , Rfl: 2 .  cyclobenzaprine (FLEXERIL) 5 MG tablet, Take 1 tablet (5 mg total) by mouth at bedtime as needed for muscle spasms., Disp: 20 tablet, Rfl: 1 .  Diclofenac Sodium (PENNSAID) 2 % SOLN, Place 1 application onto the skin 2 (two) times daily. (Patient not taking: Reported on 02/19/2020), Disp: 1  Bottle, Rfl: 3 .  predniSONE (STERAPRED UNI-PAK 21 TAB) 10 MG (21) TBPK tablet, Take as directed, Disp: 21 tablet, Rfl: 0  Current Facility-Administered Medications:  .  methylPREDNISolone acetate (DEPO-MEDROL) injection 80 mg, 80 mg, Intramuscular, Once, Isaac Bliss, Rayford Halsted, MD  Review of Systems:  Constitutional: Denies fever, chills, diaphoresis, appetite change and fatigue.  HEENT: Denies photophobia, eye pain, redness, hearing loss, ear pain, congestion, sore throat, rhinorrhea, sneezing, mouth sores, trouble swallowing, neck pain, neck stiffness and tinnitus.   Respiratory: Denies SOB, DOE, cough, chest tightness,  and wheezing.   Cardiovascular: Denies chest pain,  palpitations and leg swelling.  Gastrointestinal: Denies nausea, vomiting, abdominal pain, diarrhea, constipation, blood in stool and abdominal distention.  Genitourinary: Denies dysuria, urgency, frequency, hematuria, flank pain and difficulty urinating.  Endocrine: Denies: hot or cold intolerance, sweats, changes in hair or nails, polyuria, polydipsia. Musculoskeletal: Denies myalgias,  joint swelling, arthralgias and gait problem.  Skin: Denies pallor, rash and wound.  Neurological: Denies dizziness, seizures, syncope, weakness, light-headedness, numbness and headaches.  Hematological: Denies adenopathy. Easy bruising, personal or family bleeding history  Psychiatric/Behavioral: Denies suicidal ideation, mood changes, confusion, nervousness, sleep disturbance and agitation    Physical Exam: Vitals:   02/19/20 0855  BP: 118/78  Pulse: 61  Temp: 98 F (36.7 C)  TempSrc: Oral  SpO2: 97%  Weight: 222 lb 14.4 oz (101.1 kg)  Height: 6\' 1"  (1.854 m)    Body mass index is 29.41 kg/m.   Constitutional: NAD, calm, comfortable Eyes: PERRL, lids and conjunctivae normal ENMT: Mucous membranes are moist. Respiratory: clear to auscultation bilaterally, no wheezing, no crackles. Normal respiratory effort. No accessory muscle use.  Cardiovascular: Regular rate and rhythm, no murmurs / rubs / gallops. No extremity edema. Neurologic: Grossly intact and nonfocal Psychiatric: Normal judgment and insight. Alert and oriented x 3. Normal mood.    Impression and Plan:  Hypothyroidism, unspecified type  - Plan: TSH -We discussed possibly coming off Armour Thyroid and going on Synthroid pending results.  DDD (degenerative disc disease), lumbar -Check TSH, vitamin B12, vitamin D. -We will send him for physical therapy, do a Depo-Medrol injection in office today and another week of prednisone.  Muscle relaxers will be prescribed at bedtime. -If he fails conservative management, in another 6  weeks or so can consider an MRI to further define back anatomy.  Insomnia, unspecified type -We have discussed sleep hygiene in detail.  He will work on this.  We have discussed possibility of sleep study although he does not present with classic symptoms.     Patient Instructions  -Nice seeing you today!!  -Lab work today; will notify you once results are available.  -Prednisone take as directed on insert for 6 days.  -PT will be scheduled.  -Schedule follow up in 3 months for your physical. Please come in fasting that day.     Lelon Frohlich, MD [facility]

## 2020-02-20 ENCOUNTER — Other Ambulatory Visit: Payer: Self-pay | Admitting: Internal Medicine

## 2020-02-20 ENCOUNTER — Encounter: Payer: Self-pay | Admitting: Internal Medicine

## 2020-02-20 DIAGNOSIS — E559 Vitamin D deficiency, unspecified: Secondary | ICD-10-CM

## 2020-02-20 LAB — VITAMIN B12: Vitamin B-12: >2000 pg/mL — ABNORMAL HIGH (ref 200–1100)

## 2020-02-20 LAB — VITAMIN D 25 HYDROXY (VIT D DEFICIENCY, FRACTURES): Vit D, 25-Hydroxy: 30 ng/mL (ref 30–100)

## 2020-02-20 LAB — TSH: TSH: 2.35 mIU/L (ref 0.40–4.50)

## 2020-02-20 MED ORDER — VITAMIN D (ERGOCALCIFEROL) 1.25 MG (50000 UNIT) PO CAPS: 50000.0000 [IU] | ORAL_CAPSULE | ORAL | 0 refills | Status: DC

## 2020-03-23 ENCOUNTER — Ambulatory Visit: Payer: 59 | Attending: Internal Medicine | Admitting: Physical Therapy

## 2020-03-23 ENCOUNTER — Other Ambulatory Visit: Payer: Self-pay

## 2020-03-23 ENCOUNTER — Encounter: Payer: Self-pay | Admitting: Physical Therapy

## 2020-03-23 ENCOUNTER — Encounter: Payer: Self-pay | Admitting: Internal Medicine

## 2020-03-23 DIAGNOSIS — M5416 Radiculopathy, lumbar region: Secondary | ICD-10-CM | POA: Diagnosis not present

## 2020-03-23 DIAGNOSIS — M62838 Other muscle spasm: Secondary | ICD-10-CM | POA: Insufficient documentation

## 2020-03-23 NOTE — Therapy (Signed)
[facility] [address] [place], [place], [address] Phone: [phone]   Fax:  [phone]  Physical Therapy Evaluation  Patient Details  Name: Louis Perkins MRN: [id number] Date of Birth: December 09, 1970 Referring Provider (PT): Lelon Frohlich MD   Encounter Date: 03/23/2020   PT End of Session - 03/23/20 0936    Visit Number 1    Date for PT Re-Evaluation 05/18/20    Authorization Type [employer]    PT Start Time [phone]    PT Stop Time 1018    PT Time Calculation (min) 40 min    Activity Tolerance Patient tolerated treatment well    Behavior During Therapy [facility] for tasks assessed/performed           Past Medical History:  Diagnosis Date  . History of chicken pox     Past Surgical History:  Procedure Laterality Date  . ADENOIDECTOMY  1985  . BRAIN SURGERY    . KNEE ARTHROSCOPY W/ ACL RECONSTRUCTION Right 2006   Cadaver graft    There were no vitals filed for this visit.    Subjective Assessment - 03/23/20 0940    Subjective For a couple of years patient has had intermittent sharp pains in his mid to right low back that would last about 3 days and then resolve. He usually has some achiness daily. In the past couple of months the sharp pains have become more frequent. Pain shoots down posterior right leg to toes and he reports numbness in middles toes. He sits a lot at work but has a stand up desk as well.    Pertinent History ACL right knee and meniscus    Limitations Lifting    How long can you sit comfortably? 1 hour    How long can you stand comfortably? 45 min    How long can you walk comfortably? not limited    Patient Stated Goals decrease pain    Currently in Pain? 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  No    Is the patient reluctant to leave their home because of a fear of falling?  No      Home Ecologist residence    Additional  Comments stairs are difficult when hurting      Prior Function   Level of Independence Independent    Vocation Full time employment    Vocation Requirements desk work    Leisure work out; tae kwon do      Cognition   Overall Cognitive Status Within [place] for tasks assessed      Observation/Other Assessments   Focus on Therapeutic Outcomes (FOTO)  deferred due to pt late      Posture/Postural Control   Posture Comments some tightnss in right lumbar      ROM / Strength   AROM / PROM / Strength AROM;Strength      AROM   Overall AROM Comments Lumbar flex 75%; limited by pain, else WFL pain with left SB on right      Strength   Overall Strength Comments grossly 5/5 pain with right hip flex, right HS 4+/5      Flexibility   Soft Tissue Assessment /Muscle Length yes    Hamstrings bil Rt>Lt    Quadriceps tigh    ITB Rt tight    Piriformis Rt > Lt    Quadratus Lumborum right tight      Palpation   Spinal mobility Pain with PA to L3/4 and L4/5 with decreased mobility due to tightness    Palpation comment right  gluteals and piriformis, Rt QL      Special Tests    Special Tests Lumbar    Lumbar Tests Slump Test;Prone Knee Bend Test;Straight Leg Raise;other      Slump test   Findings Positive    Side Right    Comment toes go nunb      Prone Knee Bend Test   Findings Positive    Side Right    Comment pain in back      Straight Leg Raise   Findings Positive    Side  Right    Comment toes go numb with hip ADD      other   Comments prone press ups x 5 no change in back pain                      Objective measurements completed on examination: See above findings.                 PT Short Term Goals - 03/23/20 1315      PT SHORT TERM GOAL #1   Title Ind with initial HEP    Time 3    Period Weeks    Status New    Target Date 04/13/20             PT Long Term Goals - 03/23/20 1315      PT LONG TERM GOAL #1   Title Ind with advanced HEP    Time 8    Period Weeks    Status New    Target Date 05/18/20      PT LONG TERM GOAL #2   Title Patient able to perform ADLs without radicular sx into the Rt LE and decreased back pain by 75% or more.    Time 8    Period Weeks    Status New      PT LONG TERM GOAL #3   Title Patient to report improved standing and sitting tolerance at work by 50% or more.    Time 8    Period Weeks    Status New      PT  LONG TERM GOAL #4   Title Patient able to return to normal workout program without difficulty.    Time 8    Period Weeks    Status New                  Plan - 03/23/20 1124    Clinical Impression Statement Patient presents today with a few year h/o of low back and Rt radicular pain which has worsened in the past 3 months. He has constant pain in the low back and intermittent numbness and pain down the right leg to his toes. Pain is worse with sitting and prolonged standing. He has functional lumbar ROM but is limited in flexion by pain and tight HS. Signs and sx are consistent with a lumbar disc  protrusion, but imaging from one year ago indicates 3 mm anterolisthesis at L4/5 as well. He reports relief with press ups generally. He has mild HS weakness on the right, but otherwise 5/5 Bil LE strength. He will benefit from PT to address these deficits.    Personal Factors and Comorbidities Comorbidity 1;Comorbidity 2    Comorbidities LBP, Rt ACL and meniscus repair    Examination-Activity Limitations Bend;Lift;Stairs;Sit;Stand    Stability/Clinical Decision Making Stable/Uncomplicated    Clinical Decision Making Low    Rehab Potential Excellent    PT Frequency 2x / week    PT Duration 8 weeks    PT Treatment/Interventions ADLs/Self Care Home Management;Cryotherapy;Electrical Stimulation;Moist Heat;Therapeutic activities;Therapeutic exercise;Neuromuscular re-education;Patient/family education;Manual techniques;Dry needling;Spinal Manipulations    PT Next Visit Plan Assess nerve glide and press ups; DN to right QL/lumbar, gluteals if pt agreeable, PA mobs lumbar, femoral nerve glide if tolerated    PT Home Exercise Plan [facility]    Consulted and Agree with Plan of Care Patient           Patient will benefit from skilled therapeutic intervention in order to improve the following deficits and impairments:  Increased muscle spasms,Pain,Decreased range of motion,Decreased activity tolerance,Decreased strength,Impaired flexibility  Visit Diagnosis: Radiculopathy, lumbar region  Other muscle spasm     Problem List Patient Active Problem List   Diagnosis Date Noted  . Vitamin D deficiency 02/20/2020  . Hypothyroidism 02/19/2020  . DDD (degenerative disc disease), lumbar 02/19/2020  . Insomnia 02/19/2020  . Primary osteoarthritis of right knee 01/14/2018  . 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HCC) 08/21/2016  . History of benign brain tumor 07/04/2013  . Hearing loss in right ear 09/05/2011  . Rotator cuff impingement syndrome 07/19/2011  . Achilles tendonitis 07/19/2011  . Family  history of early CAD 07/19/2011  . Elevated BP 07/19/2011    Solon Palm PT 03/23/2020, 1:21 PM  [facility] [address] [place], [place], [address] Phone: [phone]   Fax:  [phone]  Name: Azrael Maddix MRN: [id number] Date of Birth: 1970/04/06

## 2020-03-23 NOTE — Patient Instructions (Signed)
Access Code: [facility] URL: [email or web address] Date: 03/23/2020 Prepared by: Almyra Free  Exercises Seated Sciatic Tensioner - 2 x daily - 7 x weekly - 1 sets - 10 reps - 5 second hold Prone Press Up - 3-4 x daily - 7 x weekly - 1-3 sets - 10 reps Seated Piriformis Stretch with Trunk Bend - 2 x daily - 7 x weekly - 1 sets - 3 reps - 30-60 sec hold  Patient Education Trigger Point Dry Needling

## 2020-03-24 ENCOUNTER — Encounter: Payer: Self-pay | Admitting: Internal Medicine

## 2020-03-24 ENCOUNTER — Ambulatory Visit (INDEPENDENT_AMBULATORY_CARE_PROVIDER_SITE_OTHER): Payer: 59 | Admitting: Internal Medicine

## 2020-03-24 VITALS — BP 120/84 | HR 88 | Temp 98.1°F | Wt 222.2 lb

## 2020-03-24 DIAGNOSIS — M51369 Other intervertebral disc degeneration, lumbar region without mention of lumbar back pain or lower extremity pain: Secondary | ICD-10-CM

## 2020-03-24 DIAGNOSIS — G4700 Insomnia, unspecified: Secondary | ICD-10-CM

## 2020-03-24 DIAGNOSIS — M5136 Other intervertebral disc degeneration, lumbar region: Secondary | ICD-10-CM | POA: Diagnosis not present

## 2020-03-24 DIAGNOSIS — E559 Vitamin D deficiency, unspecified: Secondary | ICD-10-CM

## 2020-03-24 MED ORDER — TRAZODONE HCL 50 MG PO TABS: 25.0000 mg | ORAL_TABLET | Freq: Every evening | ORAL | 0 refills | Status: DC | PRN

## 2020-03-24 MED ORDER — METHYLPREDNISOLONE ACETATE 80 MG/ML IJ SUSP
80.0000 mg | Freq: Once | INTRAMUSCULAR | Status: AC
  Administered 2020-03-24: 80 mg via INTRAMUSCULAR

## 2020-03-24 NOTE — Progress Notes (Signed)
Established Patient Office Visit     This visit occurred during the SARS-CoV-2 public health emergency.  Safety protocols were in place, including screening questions prior to the visit, additional usage of staff PPE, and extensive cleaning of exam room while observing appropriate contact time as indicated for disinfecting solutions.    CC/Reason for Visit: Discuss ongoing issues  HPI: Louis Perkins is a 50 y.o. male who is coming in today for the above mentioned reasons.  He continues to experience lower back pain radiating down his right leg.  He states that steroid injection he received 6 weeks ago was very helpful but has now worn off.  He had his first physical therapy assessment yesterday.  He is requesting another steroid injection today if possible.  He continues to have issues with insomnia.  He will work occasional shifts at night and he feels this messes with his sleep cycle.  He is requesting if possible some sleeping aids.  For him to take intermittently.    Past Medical/Surgical History: Past Medical History:  Diagnosis Date  . History of chicken pox     Past Surgical History:  Procedure Laterality Date  . ADENOIDECTOMY  1985  . BRAIN SURGERY    . KNEE ARTHROSCOPY W/ ACL RECONSTRUCTION Right 2006   Cadaver graft    Social History:  reports that he has never smoked. He has never used smokeless tobacco. He reports current alcohol use. He reports that he does not use drugs.  Allergies: No Known Allergies  Family History:  Family History  Problem Relation Age of Onset  . Heart disease Father   . Cancer Maternal Uncle        prostate CA  . Arthritis Maternal Grandmother   . Hypertension Maternal Grandmother   . Cancer Cousin        breast CA     Current Outpatient Medications:  .  ARMOUR THYROID 15 MG tablet, Take 15 mg by mouth daily., Disp: , Rfl:  .  ARMOUR THYROID 30 MG tablet, TAKE 1 TABLET BY MOUTH EVERY DAY ON EMPTY STOMACH, Disp: , Rfl:  2 .  cyclobenzaprine (FLEXERIL) 5 MG tablet, Take 1 tablet (5 mg total) by mouth at bedtime as needed for muscle spasms., Disp: 20 tablet, Rfl: 1 .  Diclofenac Sodium (PENNSAID) 2 % SOLN, Place 1 application onto the skin 2 (two) times daily., Disp: 1 Bottle, Rfl: 3 .  traZODone (DESYREL) 50 MG tablet, Take 0.5-1 tablets (25-50 mg total) by mouth at bedtime as needed for sleep., Disp: 20 tablet, Rfl: 0 .  Vitamin D, Ergocalciferol, (DRISDOL) 1.25 MG (50000 UNIT) CAPS capsule, Take 1 capsule (50,000 Units total) by mouth every 7 (seven) days for 12 doses., Disp: 12 capsule, Rfl: 0  Current Facility-Administered Medications:  .  methylPREDNISolone acetate (DEPO-MEDROL) injection 80 mg, 80 mg, Intramuscular, Once, Isaac Bliss, Rayford Halsted, MD  Review of Systems:  Constitutional: Denies fever, chills, diaphoresis, appetite change and fatigue.  HEENT: Denies photophobia, eye pain, redness, hearing loss, ear pain, congestion, sore throat, rhinorrhea, sneezing, mouth sores, trouble swallowing, neck pain, neck stiffness and tinnitus.   Respiratory: Denies SOB, DOE, cough, chest tightness,  and wheezing.   Cardiovascular: Denies chest pain, palpitations and leg swelling.  Gastrointestinal: Denies nausea, vomiting, abdominal pain, diarrhea, constipation, blood in stool and abdominal distention.  Genitourinary: Denies dysuria, urgency, frequency, hematuria, flank pain and difficulty urinating.  Endocrine: Denies: hot or cold intolerance, sweats, changes in hair or nails, polyuria,  polydipsia. Musculoskeletal: Denies myalgias, back pain, joint swelling, arthralgias and gait problem.  Skin: Denies pallor, rash and wound.  Neurological: Denies dizziness, seizures, syncope, weakness, light-headedness, numbness and headaches.  Hematological: Denies adenopathy. Easy bruising, personal or family bleeding history  Psychiatric/Behavioral: Denies suicidal ideation, mood changes, confusion, nervousness and  agitation    Physical Exam: Vitals:   03/24/20 1607  BP: 120/84  Pulse: 88  Temp: 98.1 F (36.7 C)  TempSrc: Oral  SpO2: 95%  Weight: 222 lb 3.2 oz (100.8 kg)    Body mass index is 29.32 kg/m.   Constitutional: NAD, calm, comfortable Eyes: PERRL, lids and conjunctivae normal ENMT: Mucous membranes are moist.   Psychiatric: Normal judgment and insight. Alert and oriented x 3. Normal mood.    Impression and Plan:  DDD (degenerative disc disease), lumbar  -Agree with another methylprednisolone injection today. -Continue PT sessions. -If no improvement in 6 to 8 weeks consider MRI to define anatomy.  Insomnia, unspecified type -We have discussed sleep hygiene. -I will prescribe trazodone 50 mg for him to take half to 1 full tablet as needed for sleep, he has been advised to use this sporadically only.  Vitamin D deficiency -He will continue to take vitamin D.   Patient Instructions  -Nice seeing you today!!  -Steroid injection today.  -Start trazodone 0.5-1 tablets at nighttime as needed for sleep.  -Make sure you are taking your Vit D once a week for 12 weeks as previously prescribe.  -Schedule follow up in 3 months or sooner as needed.     Lelon Frohlich, MD [facility]

## 2020-03-24 NOTE — Patient Instructions (Signed)
-  Nice seeing you today!!  -Steroid injection today.  -Start trazodone 0.5-1 tablets at nighttime as needed for sleep.  -Make sure you are taking your Vit D once a week for 12 weeks as previously prescribe.  -Schedule follow up in 3 months or sooner as needed.

## 2020-03-30 ENCOUNTER — Encounter: Payer: 59 | Admitting: Physical Therapy

## 2020-04-02 ENCOUNTER — Ambulatory Visit: Payer: 59 | Admitting: Physical Therapy

## 2020-04-02 ENCOUNTER — Other Ambulatory Visit: Payer: Self-pay

## 2020-04-02 ENCOUNTER — Ambulatory Visit: Payer: 59 | Admitting: Internal Medicine

## 2020-04-02 DIAGNOSIS — M62838 Other muscle spasm: Secondary | ICD-10-CM

## 2020-04-02 DIAGNOSIS — M5416 Radiculopathy, lumbar region: Secondary | ICD-10-CM | POA: Diagnosis not present

## 2020-04-02 NOTE — Therapy (Signed)
[facility] [address] [place], [place], [address] Phone: [phone]   Fax:  [phone]  Physical Therapy Treatment  Patient Details  Name: Louis Perkins MRN: [id number] Date of Birth: September 01, 1970 Referring Provider (PT): Lelon Frohlich MD   Encounter Date: 04/02/2020   PT End of Session - 04/02/20 1159    Visit Number 2    Date for PT Re-Evaluation 05/18/20    Authorization Type UHC    PT Start Time 1105    PT Stop Time 1150    PT Time Calculation (min) 45 min    Activity Tolerance Patient tolerated treatment well           Past Medical History:  Diagnosis Date   History of chicken pox     Past Surgical History:  Procedure Laterality Date   ADENOIDECTOMY  1985   BRAIN SURGERY     KNEE ARTHROSCOPY W/ ACL RECONSTRUCTION Right 2006   Cadaver graft    There were no vitals filed for this visit.   Subjective Assessment - 04/02/20 1107    Subjective Across low back and at times down right leg sharp.  Sitting 1 hour would cause numbness/pain to toes.  2 injections helped no sharp pain.  Press ups 1x/day seems to alleviate leg pain. Last time I had leg symptoms was the last time I was here.   I haven't had a chance to read about the DN.    Pertinent History ACL right knee and meniscus    Currently in Pain? Yes    Pain Score 2     Pain Location Back    Pain Orientation Right    Pain Descriptors / Indicators Aching    Pain Type Chronic pain                             [facility] Adult PT Treatment/Exercise - 04/02/20 0001      Lumbar Exercises: Standing   Other Standing Lumbar Exercises wall sidegliding shoulders right, hips left 10x      Lumbar Exercises: Sidelying   Other Sidelying Lumbar Exercises right sidelying over folded pillow with added overpressure for flexion/rotation 3 min      Lumbar Exercises: Prone   Other Prone Lumbar Exercises press ups 8x; with  exhale at the top 5x    Other Prone Lumbar Exercises press ups with belt fixation 10x      Manual Therapy   Manual therapy comments manual overpressure to press ups 8x;  overpressure to flexion rotation    Joint Mobilization PA grade 3/4 mobs L1-L5 30 sec each level                  PT Education - 04/02/20 1154    Education Details overpressure with press ups; wall sideglide and sidelying over pillow flexion rotation    Person(s) Educated Patient    Methods Explanation;Demonstration;Handout    Comprehension Returned demonstration;Verbalized understanding            PT Short Term Goals - 03/23/20 1315      PT SHORT TERM GOAL #1   Title Ind with initial HEP    Time 3    Period Weeks    Status New    Target Date 04/13/20             PT Long Term Goals - 03/23/20 1315      PT LONG TERM GOAL #1  Title Ind with advanced HEP    Time 8    Period Weeks    Status New    Target Date 05/18/20      PT LONG TERM GOAL #2   Title Patient able to perform ADLs without radicular sx into the Rt LE and decreased back pain by 75% or more.    Time 8    Period Weeks    Status New      PT LONG TERM GOAL #3   Title Patient to report improved standing and sitting tolerance at work by 50% or more.    Time 8    Period Weeks    Status New      PT LONG TERM GOAL #4   Title Patient able to return to normal workout program without difficulty.    Time 8    Period Weeks    Status New                 Plan - 04/02/20 1143    Clinical Impression Statement The patient reports right > left low back pain  with intermittent right LE peripheral symptoms.  He reports no sharper, LE pain since initial assessment but unchanging constant right > left LBP especially in the AM and with prolonged sitting and standing.  He states prone press ups helps with the leg pain but not really changing LBP (admits only performing 1x/day.  Encouraged increased frequency of performance and added  overpressure which centralized pain to midline.  Lateral directional component with wall side gliding and sidelying flex/rotation alleviated pain to 1/10 and remained central.  Discussed centralization principles and added progression to HEP.  He may benefit from DN lumbar multifidi and QL for secondary tightness associated with this long term problem but he would like to further research it.    Personal Factors and Comorbidities Comorbidity 1;Comorbidity 2    Comorbidities LBP, Rt ACL and meniscus repair    Examination-Activity Limitations Bend;Lift;Stairs;Sit;Stand    Rehab Potential Excellent    PT Frequency 2x / week    PT Duration 8 weeks    PT Treatment/Interventions ADLs/Self Care Home Management;Cryotherapy;Electrical Stimulation;Moist Heat;Therapeutic activities;Therapeutic exercise;Neuromuscular re-education;Patient/family education;Manual techniques;Dry needling;Spinal Manipulations    PT Next Visit Plan assess response to overpressure with press ups and lateral component techniques; lumbar joint mobs;  add supine sciatic neural glide; possible  DN lumbar multifidi, right QL    PT Home Exercise Plan [facility]           Patient will benefit from skilled therapeutic intervention in order to improve the following deficits and impairments:  Increased muscle spasms,Pain,Decreased range of motion,Decreased activity tolerance,Decreased strength,Impaired flexibility  Visit Diagnosis: Radiculopathy, lumbar region  Other muscle spasm     Problem List Patient Active Problem List   Diagnosis Date Noted   Vitamin D deficiency 02/20/2020   Hypothyroidism 02/19/2020   DDD (degenerative disc disease), lumbar 02/19/2020   Insomnia 02/19/2020   Primary osteoarthritis of right knee 01/14/2018   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, [place], [place], [address] Phone: [phone]  Fax:  [phone]  Name: Govind Furey MRN: [id number] Date of Birth: 28-Jan-1971

## 2020-04-02 NOTE — Patient Instructions (Signed)
Access Code: [facility] URL: [email or web address] Date: 04/02/2020 Prepared by: Ruben Im  Exercises Seated Sciatic Tensioner - 2 x daily - 7 x weekly - 1 sets - 10 reps - 5 second hold Prone Press Up - 3-4 x daily - 7 x weekly - 1 sets - 10 reps Seated Piriformis Stretch with Trunk Bend - 2 x daily - 7 x weekly - 1 sets - 3 reps - 30-60 sec hold Lateral Shift Correction at Wall - 1 x daily - 7 x weekly - 1 sets - 10 reps Sidelying Lumbar Rotation Stretch - 1 x daily - 7 x weekly - 1 sets - 1 reps - 2 min hold  Patient Education Trigger Point Dry Needling

## 2020-04-06 ENCOUNTER — Encounter: Payer: 59 | Admitting: Physical Therapy

## 2020-04-07 ENCOUNTER — Encounter: Payer: Self-pay | Admitting: Physical Therapy

## 2020-04-07 ENCOUNTER — Ambulatory Visit: Payer: 59 | Admitting: Physical Therapy

## 2020-04-07 ENCOUNTER — Other Ambulatory Visit: Payer: Self-pay

## 2020-04-07 DIAGNOSIS — M5416 Radiculopathy, lumbar region: Secondary | ICD-10-CM

## 2020-04-07 DIAGNOSIS — M62838 Other muscle spasm: Secondary | ICD-10-CM

## 2020-04-07 NOTE — Therapy (Signed)
[facility] [address] [place], [place], [address] Phone: [phone]   Fax:  [phone]  Physical Therapy Treatment  Patient Details  Name: Louis Perkins MRN: [id number] Date of Birth: 08/04/1970 Referring Provider (PT): Lelon Frohlich MD   Encounter Date: 04/07/2020   PT End of Session - 04/07/20 1149    Visit Number 3    Date for PT Re-Evaluation 05/18/20    Authorization Type UHC    PT Start Time 2423    PT Stop Time 5361    PT Time Calculation (min) 48 min    Activity Tolerance Patient tolerated treatment well    Behavior During Therapy [facility] for tasks assessed/performed           Past Medical History:  Diagnosis Date  . History of chicken pox     Past Surgical History:  Procedure Laterality Date  . ADENOIDECTOMY  1985  . BRAIN SURGERY    . KNEE ARTHROSCOPY W/ ACL RECONSTRUCTION Right 2006   Cadaver graft    There were no vitals filed for this visit.   Subjective Assessment - 04/07/20 1149    Subjective A little angry today 4/10 just in the back. A little numb in the leg when sitting.    Currently in Pain? Yes    Pain Score 4     Pain Location Back    Pain Orientation Right                             [facility] Adult PT Treatment/Exercise - 04/07/20 0001      Lumbar Exercises: Standing   Other Standing Lumbar Exercises demo's standing traction at sink which pt reports he does intermittently      Lumbar Exercises: Sidelying   Other Sidelying Lumbar Exercises right sidelying over folded pillow with added overpressure for flexion/rotation 2 min      Lumbar Exercises: Prone   Other Prone Lumbar Exercises press ups x 5 ea at L4/5 and L5/S1 with overpressure; pressups x 5 before and after manual      Modalities   Modalities Moist Heat      Moist Heat Therapy   Number Minutes Moist Heat 10 Minutes    Moist Heat Location Lumbar Spine      Manual Therapy    Manual Therapy Soft tissue mobilization;Joint mobilization    Manual therapy comments Skilled palpation and monitoring of soft tissues during DN    Joint Mobilization UPA grade 3/4 to L4/5, L5/S1; and overpressure with press ups    Soft tissue mobilization to bil lumbar/QL            Trigger Point Dry Needling - 04/07/20 0001    Consent Given? Yes    Education Handout Provided Previously provided    Muscles Treated Back/Hip Erector spinae;Lumbar multifidi;Quadratus lumborum    Dry Needling Comments bil    Erector spinae Response Twitch response elicited;Palpable increased muscle length    Lumbar multifidi Response Twitch response elicited;Palpable increased muscle length    Quadratus Lumborum Response Twitch response elicited;Palpable increased muscle length                  PT Short Term Goals - 03/23/20 1315      PT SHORT TERM GOAL #1   Title Ind with initial HEP    Time 3    Period Weeks    Status New    Target Date 04/13/20  PT Long Term Goals - 03/23/20 1315      PT LONG TERM GOAL #1   Title Ind with advanced HEP    Time 8    Period Weeks    Status New    Target Date 05/18/20      PT LONG TERM GOAL #2   Title Patient able to perform ADLs without radicular sx into the Rt LE and decreased back pain by 75% or more.    Time 8    Period Weeks    Status New      PT LONG TERM GOAL #3   Title Patient to report improved standing and sitting tolerance at work by 50% or more.    Time 8    Period Weeks    Status New      PT LONG TERM GOAL #4   Title Patient able to return to normal workout program without difficulty.    Time 8    Period Weeks    Status New                 Plan - 04/07/20 1227    Clinical Impression Statement Patient reporting consistent centralization with sx with press ups and SDLY rotation. UPA mobs to L4/5 and L5/S1 both centralize pain to midline as well. He has marked tightness in bil paraspinals and QL Rt>Lt and  we did intiial trial of DN/manual today with good results. Patient reported increased mobility with both press ups and rotation after manual. Patient also reporting decreased tension and achiness in gluteals at end of session.    PT Frequency 2x / week    PT Duration 8 weeks    PT Treatment/Interventions ADLs/Self Care Home Management;Cryotherapy;Electrical Stimulation;Moist Heat;Therapeutic activities;Therapeutic exercise;Neuromuscular re-education;Patient/family education;Manual techniques;Dry needling;Spinal Manipulations    PT Next Visit Plan assess response to DN to bil  lumbar multifidi and QL and assess gluteals for DN; assess response to overpressure with press ups and lateral component techniques; lumbar joint mobs;  add supine sciatic neural glide;    PT Home Exercise Plan [facility]           Patient will benefit from skilled therapeutic intervention in order to improve the following deficits and impairments:  Increased muscle spasms,Pain,Decreased range of motion,Decreased activity tolerance,Decreased strength,Impaired flexibility  Visit Diagnosis: Radiculopathy, lumbar region  Other muscle spasm     Problem List Patient Active Problem List   Diagnosis Date Noted  . Vitamin D deficiency 02/20/2020  . Hypothyroidism 02/19/2020  . DDD (degenerative disc disease), lumbar 02/19/2020  . Insomnia 02/19/2020  . Primary osteoarthritis of right knee 01/14/2018  . 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 History of benign brain tumor 07/04/2013  . Hearing loss in right ear 09/05/2011  . Rotator cuff impingement syndrome 07/19/2011  . Achilles tendonitis 07/19/2011  . Family history of early CAD 07/19/2011  . Elevated BP 07/19/2011   Madelyn Flavors PT 04/07/2020, 12:52 PM  [facility] [address], [place], [place], [address] Phone: [phone]   Fax:  [phone]  Name: Louis Perkins MRN:  [id number] Date of Birth: Dec 14, 1970

## 2020-04-09 ENCOUNTER — Other Ambulatory Visit: Payer: Self-pay

## 2020-04-09 ENCOUNTER — Ambulatory Visit: Payer: 59 | Admitting: Physical Therapy

## 2020-04-09 ENCOUNTER — Encounter: Payer: Self-pay | Admitting: Physical Therapy

## 2020-04-09 DIAGNOSIS — M62838 Other muscle spasm: Secondary | ICD-10-CM

## 2020-04-09 DIAGNOSIS — M5416 Radiculopathy, lumbar region: Secondary | ICD-10-CM

## 2020-04-09 NOTE — Therapy (Signed)
[facility] [address] [place], [place], [address] Phone: [phone]   Fax:  [phone]  Physical Therapy Treatment  Patient Details  Name: Louis Perkins MRN: [id number] Date of Birth: [birth year] Referring Provider (PT): Lelon Frohlich MD   Encounter Date: 04/09/2020   PT End of Session - 04/09/20 0807    Visit Number 4    Date for PT Re-Evaluation 05/18/20    Authorization Type UHC    PT Start Time 0807    PT Stop Time 0849    PT Time Calculation (min) 42 min    Activity Tolerance Patient tolerated treatment well    Behavior During Therapy [facility] for tasks assessed/performed           Past Medical History:  Diagnosis Date  . History of chicken pox     Past Surgical History:  Procedure Laterality Date  . ADENOIDECTOMY  1985  . BRAIN SURGERY    . KNEE ARTHROSCOPY W/ ACL RECONSTRUCTION Right 2006   Cadaver graft    There were no vitals filed for this visit.   Subjective Assessment - 04/09/20 0809    Subjective Pain is a 3/10 this AM, no radicular symptoms.    Currently in Pain? Yes    Pain Score 3     Pain Location Back    Pain Orientation Right    Pain Descriptors / Indicators Dull;Sore    Aggravating Factors  lifting, sitting  too long    Pain Relieving Factors needling seemed to help                             [facility] Adult PT Treatment/Exercise - 04/09/20 0001      Lumbar Exercises: Aerobic   UBE (Upper Arm Bike) L2 x 10 min for tissue prep and discussion of current status      Lumbar Exercises: Supine   Other Supine Lumbar Exercises Supine blue roll lumbar release series via Melt method    Other Supine Lumbar Exercises Pilates single leg stretch 10x   added to HEP     Lumbar Exercises: Quadruped   Opposite Arm/Leg Raise Right arm/Left leg;Left arm/Right leg;5 reps;3 seconds   added to HEP                 PT Education - 04/09/20 1011     Education Details HEP: pt will hold on his "in & outs" and perform more intermediate, supported exercises: Bird Dog, Pilates single leg stretch, and fascial release in lumbar with foam roll,    Person(s) Educated Patient    Methods Explanation;Demonstration;Verbal cues;Handout    Comprehension Returned demonstration;Verbalized understanding            PT Short Term Goals - 03/23/20 1315      PT SHORT TERM GOAL #1   Title Ind with initial HEP    Time 3    Period Weeks    Status New    Target Date 04/13/20             PT Long Term Goals - 03/23/20 1315      PT LONG TERM GOAL #1   Title Ind with advanced HEP    Time 8    Period Weeks    Status New    Target Date 05/18/20      PT LONG TERM GOAL #2   Title Patient able to perform ADLs without radicular sx into  the Rt LE and decreased back pain by 75% or more.    Time 8    Period Weeks    Status New      PT LONG TERM GOAL #3   Title Patient to report improved standing and sitting tolerance at work by 50% or more.    Time 8    Period Weeks    Status New      PT LONG TERM GOAL #4   Title Patient able to return to normal workout program without difficulty.    Time 8    Period Weeks    Status New                 Plan - 04/09/20 4627    Clinical Impression Statement Pt arrives with mild Rt low back pain, denies any leg pain this AM. He is compliant with HEP. He did well with dry needling and would like to do another round of it. Pt was given some core exercises that are more intermediate and supported in nature vs what he typically does at the gym. Pt was educated in how to use the foam roll ( he has a half roll)  for lumbar release and hydration at home, this also included Rt proximal quad/RF and hip flexor stretch/release.    Personal Factors and Comorbidities Comorbidity 1;Comorbidity 2    Comorbidities LBP, Rt ACL and meniscus repair    Examination-Activity Limitations Bend;Lift;Stairs;Sit;Stand     Stability/Clinical Decision Making Stable/Uncomplicated    Rehab Potential Excellent    PT Frequency 2x / week    PT Duration 8 weeks    PT Treatment/Interventions ADLs/Self Care Home Management;Cryotherapy;Electrical Stimulation;Moist Heat;Therapeutic activities;Therapeutic exercise;Neuromuscular re-education;Patient/family education;Manual techniques;Dry needling;Spinal Manipulations    PT Next Visit Plan See how new HEP is going, DN #2    PT Home Exercise Plan [facility]    Consulted and Agree with Plan of Care Patient           Patient will benefit from skilled therapeutic intervention in order to improve the following deficits and impairments:  Increased muscle spasms,Pain,Decreased range of motion,Decreased activity tolerance,Decreased strength,Impaired flexibility  Visit Diagnosis: Radiculopathy, lumbar region  Other muscle spasm     Problem List Patient Active Problem List   Diagnosis Date Noted  . Vitamin D deficiency 02/20/2020  . Hypothyroidism 02/19/2020  . DDD (degenerative disc disease), lumbar 02/19/2020  . Insomnia 02/19/2020  . Primary osteoarthritis of right knee 01/14/2018  . 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 History of benign brain tumor 07/04/2013  . Hearing loss in right ear 09/05/2011  . Rotator cuff impingement syndrome 07/19/2011  . Achilles tendonitis 07/19/2011  . Family history of early CAD 07/19/2011  . Elevated BP 07/19/2011    Hansini Clodfelter, PTA 04/09/2020, 10:18 AM  [facility] [address] [place], [place], [address] Phone: [phone]   Fax:  [phone]  Name: Louis Perkins MRN: [id number] Date of Birth: [birth year]

## 2020-04-13 ENCOUNTER — Ambulatory Visit: Payer: 59 | Attending: Internal Medicine | Admitting: Physical Therapy

## 2020-04-13 ENCOUNTER — Encounter: Payer: Self-pay | Admitting: Physical Therapy

## 2020-04-13 ENCOUNTER — Other Ambulatory Visit: Payer: Self-pay

## 2020-04-13 DIAGNOSIS — M5416 Radiculopathy, lumbar region: Secondary | ICD-10-CM | POA: Insufficient documentation

## 2020-04-13 DIAGNOSIS — M62838 Other muscle spasm: Secondary | ICD-10-CM | POA: Diagnosis present

## 2020-04-13 NOTE — Therapy (Signed)
[facility] [address], [place], [place], [address] Phone: [phone]   Fax:  [phone]  Physical Therapy Treatment  Patient Details  Name: Louis Perkins MRN: [id number] Date of Birth: 31-Jul-1970 Referring Provider (PT): Lelon Frohlich MD   Encounter Date: 04/13/2020   PT End of Session - 04/13/20 1017    Visit Number 5    Date for PT Re-Evaluation 05/18/20    Authorization Type UHC    PT Start Time 1017    PT Stop Time 1114    PT Time Calculation (min) 57 min    Activity Tolerance Patient tolerated treatment well    Behavior During Therapy [facility] for tasks assessed/performed           Past Medical History:  Diagnosis Date  . History of chicken pox     Past Surgical History:  Procedure Laterality Date  . ADENOIDECTOMY  1985  . BRAIN SURGERY    . KNEE ARTHROSCOPY W/ ACL RECONSTRUCTION Right 2006   Cadaver graft    There were no vitals filed for this visit.   Subjective Assessment - 04/13/20 1019    Subjective Pain was 4/10 yesterday. Patient able to sit and stand about 25% longer before onset of pain.    Pertinent History ACL right knee and meniscus    Patient Stated Goals decrease pain    Currently in Pain? Yes    Pain Score 2     Pain Location Back    Pain Orientation Right    Pain Descriptors / Indicators Dull;Sore                             [facility] Adult PT Treatment/Exercise - 04/13/20 0001      Lumbar Exercises: Aerobic   Elliptical 5 min I5 R5 while goals assessed      Lumbar Exercises: Standing   Other Standing Lumbar Exercises standing dead lift with bar x 3 (good form)      Lumbar Exercises: Supine   Dead Bug 5 reps    Dead Bug Limitations good form; also did just legs first x 5      Lumbar Exercises: Prone   Other Prone Lumbar Exercises side plank trials: good demo bil; also did with arm ABD 90 deg then rotating to floor and return      Lumbar  Exercises: Quadruped   Opposite Arm/Leg Raise Right arm/Left leg;Left arm/Right leg;5 reps;3 seconds   added to HEP   Opposite Arm/Leg Raise Limitations PT demo's ab crunch to progress    Plank plank with hip ABD and hip ext x 5 ea bil      Modalities   Modalities Moist Heat      Moist Heat Therapy   Number Minutes Moist Heat 10 Minutes    Moist Heat Location Lumbar Spine;Hip      Manual Therapy   Manual Therapy Soft tissue mobilization;Joint mobilization    Manual therapy comments Skilled palpation and monitoring of soft tissues during DN    Joint Mobilization PA mobs            Trigger Point Dry Needling - 04/13/20 0001    Consent Given? Yes    Education Handout Provided Previously provided    Muscles Treated Back/Hip Gluteus minimus;Gluteus medius;Erector spinae;Lumbar multifidi    Dry Needling Comments right gluteals; else bil    Gluteus Minimus Response Twitch response elicited;Palpable increased muscle length  Gluteus Medius Response Twitch response elicited;Palpable increased muscle length    Erector spinae Response Twitch response elicited;Palpable increased muscle length    Lumbar multifidi Response Twitch response elicited;Palpable increased muscle length                  PT Short Term Goals - 04/13/20 1020      PT SHORT TERM GOAL #1   Title Ind with initial HEP    Status Achieved             PT Long Term Goals - 04/13/20 1020      PT LONG TERM GOAL #1   Title Ind with advanced HEP    Status On-going      PT LONG TERM GOAL #2   Title Patient able to perform ADLs without radicular sx into the Rt LE and decreased back pain by 75% or more.    Status On-going      PT LONG TERM GOAL #3   Title Patient to report improved standing and sitting tolerance at work by 50% or more.    Baseline 25% improvement    Status On-going      PT LONG TERM GOAL #4   Title Patient able to return to normal workout program without difficulty.    Status On-going                  Plan - 04/13/20 1817    Clinical Impression Statement Patient is progressing well with LTGs. His endurance for sitting and standing has improved 25% and his resting pain has decreased overall. He continues to have significant tightness in his right lumbar and lower throacic paraspinals and bil lumbar musculature. Additionally he had marked tightness and trigger points in his right gluteus min and med today. He responds very well to DN, mobs and STW to these areas. Pt advised to use self MFR to gluteals as well as lumbar to help maintain gains in tissue extensibilty. We did advanced core today with planks/ side planks which pt did wel with. Some minor cueing with plank. He demo'd his dead lift form with barbell with no corrections needed.    PT Frequency 2x / week    PT Duration 8 weeks    PT Treatment/Interventions ADLs/Self Care Home Management;Cryotherapy;Electrical Stimulation;Moist Heat;Therapeutic activities;Therapeutic exercise;Neuromuscular re-education;Patient/family education;Manual techniques;Dry needling;Spinal Manipulations    PT Next Visit Plan Progress to standing core ([place], SL dead lifts, lunges with biceps curls and/or OH press); STW prn    PT Home Exercise Plan [facility]           Patient will benefit from skilled therapeutic intervention in order to improve the following deficits and impairments:  Increased muscle spasms,Pain,Decreased range of motion,Decreased activity tolerance,Decreased strength,Impaired flexibility  Visit Diagnosis: Radiculopathy, lumbar region  Other muscle spasm     Problem List Patient Active Problem List   Diagnosis Date Noted  . Vitamin D deficiency 02/20/2020  . Hypothyroidism 02/19/2020  . DDD (degenerative disc disease), lumbar 02/19/2020  . Insomnia 02/19/2020  . Primary osteoarthritis of right knee 01/14/2018  . 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 History of benign brain tumor 07/04/2013  .  Hearing loss in right ear 09/05/2011  . Rotator cuff impingement syndrome 07/19/2011  . Achilles tendonitis 07/19/2011  . Family history of early CAD 07/19/2011  . Elevated BP 07/19/2011    Madelyn Flavors PT 04/13/2020, 6:25 PM  [facility] [address] [place],  [place], [address] Phone: [phone]   Fax:  [phone]  Name: Louis Perkins MRN: [id number] Date of Birth: 08-24-1970

## 2020-04-16 ENCOUNTER — Ambulatory Visit: Payer: 59 | Admitting: Physical Therapy

## 2020-04-20 ENCOUNTER — Other Ambulatory Visit: Payer: Self-pay

## 2020-04-20 ENCOUNTER — Encounter: Payer: Self-pay | Admitting: Physical Therapy

## 2020-04-20 ENCOUNTER — Ambulatory Visit: Payer: 59 | Admitting: Physical Therapy

## 2020-04-20 DIAGNOSIS — M5416 Radiculopathy, lumbar region: Secondary | ICD-10-CM

## 2020-04-20 DIAGNOSIS — M62838 Other muscle spasm: Secondary | ICD-10-CM

## 2020-04-20 NOTE — Therapy (Addendum)
[facility] [address] [place], [place], [address] Phone: [phone]   Fax:  [phone]  Physical Therapy Treatment  Patient Details  Name: Louis Perkins MRN: [id number] Date of Birth: Jun 28, 1970 Referring Provider (PT): Lelon Frohlich MD   Encounter Date: 04/20/2020   PT End of Session - 04/20/20 0944    Visit Number 6    Date for PT Re-Evaluation 05/18/20    Authorization Type UHC    PT Start Time [phone]    PT Stop Time 1025    PT Time Calculation (min) 42 min    Activity Tolerance Patient tolerated treatment well    Behavior During Therapy [facility] for tasks assessed/performed           Past Medical History:  Diagnosis Date  . History of chicken pox     Past Surgical History:  Procedure Laterality Date  . ADENOIDECTOMY  1985  . BRAIN SURGERY    . KNEE ARTHROSCOPY W/ ACL RECONSTRUCTION Right 2006   Cadaver graft    There were no vitals filed for this visit.   Subjective Assessment - 04/20/20 0944    Subjective Patient reports no pain today. He has good and bad days. Sunday was bad. He had sat around all day on Saturday.    Pertinent History ACL right knee and meniscus    Patient Stated Goals decrease pain    Currently in Pain? No/denies                             [facility] Adult PT Treatment/Exercise - 04/20/20 0001      Lumbar Exercises: Aerobic   Elliptical 6 min I5 R5 while goals assessed and POC discussed      Modalities   Modalities Moist Heat      Moist Heat Therapy   Number Minutes Moist Heat 10 Minutes    Moist Heat Location Lumbar Spine;Hip      Manual Therapy   Manual Therapy Soft tissue mobilization;Joint mobilization    Manual therapy comments Skilled palpation and monitoring of soft tissues during DN    Joint Mobilization UPA mobs to bil lumbar            Trigger Point Dry Needling - 04/20/20 0001    Consent Given? Yes    Education Handout  Provided Previously provided    Muscles Treated Back/Hip Gluteus minimus;Gluteus medius;Erector spinae;Lumbar multifidi    Dry Needling Comments bil    Gluteus Minimus Response Twitch response elicited;Palpable increased muscle length    Gluteus Medius Response Twitch response elicited;Palpable increased muscle length    Erector spinae Response Twitch response elicited;Palpable increased muscle length    Lumbar multifidi Response Twitch response elicited;Palpable increased muscle length                  PT Short Term Goals - 04/13/20 1020      PT SHORT TERM GOAL #1   Title Ind with initial HEP    Status Achieved             PT Long Term Goals - 04/20/20 0948      PT LONG TERM GOAL #1   Title Ind with advanced HEP    Status Achieved      PT LONG TERM GOAL #2   Title Patient able to perform ADLs without radicular sx into the Rt LE and decreased back pain by 75% or more.  Baseline 30-40% improvement    Status Not Met      PT LONG TERM GOAL #3   Title Patient to report improved standing and sitting tolerance at work by 50% or more.    Baseline 30-40% improvement    Status Not Met      PT LONG TERM GOAL #4   Title Patient able to return to normal workout program without difficulty.    Status Achieved                 Plan - 04/20/20 1221    Clinical Impression Statement Patient presented today with no pain and no radicular sx. He states that he still has good days and bad days and pain is dependent primarily on how long he sits. He works 12 hour shifts so often this exacerbates pain even when he is standing. We discussed setting a timer to remind him to stand once an hour to help fend off pain before it starts. He is happy with his current functional level and feels he has the tools to alleviate pain when and if it occurs, other than manual therapy. He has very good core strength and has returned to his previous workouts without increased pain. Plan to place  Jrue on hold x 4 wks until his POC ends to see how he does functionally. He still had tightness in bil lumbar and gluteals today, but significantly less than previous visits. He had normal response to manual therapy and DN. He arrived late today due to 2-hour school delay.    Comorbidities LBP, Rt ACL and meniscus repair    PT Frequency 2x / week    PT Duration 8 weeks    PT Treatment/Interventions ADLs/Self Care Home Management;Cryotherapy;Electrical Stimulation;Moist Heat;Therapeutic activities;Therapeutic exercise;Neuromuscular re-education;Patient/family education;Manual techniques;Dry needling;Spinal Manipulations    PT Next Visit Plan Place on hold until 05/18/20. D/C if pt does not return.    PT Home Exercise Plan [facility]    Consulted and Agree with Plan of Care Patient           Patient will benefit from skilled therapeutic intervention in order to improve the following deficits and impairments:  Increased muscle spasms,Pain,Decreased range of motion,Decreased activity tolerance,Decreased strength,Impaired flexibility  Visit Diagnosis: Radiculopathy, lumbar region  Other muscle spasm     Problem List Patient Active Problem List   Diagnosis Date Noted  . Vitamin D deficiency 02/20/2020  . Hypothyroidism 02/19/2020  . DDD (degenerative disc disease), lumbar 02/19/2020  . Insomnia 02/19/2020  . Primary osteoarthritis of right knee 01/14/2018  . 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 History of benign brain tumor 07/04/2013  . Hearing loss in right ear 09/05/2011  . Rotator cuff impingement syndrome 07/19/2011  . Achilles tendonitis 07/19/2011  . Family history of early CAD 07/19/2011  . Elevated BP 07/19/2011   Madelyn Flavors PT 04/20/2020, 5:38 PM PHYSICAL THERAPY DISCHARGE SUMMARY  Visits from Start of Care: 6  Current functional level related to goals / functional outcomes: See above for most current status.   Remaining deficits: See above    Education / Equipment: HEP Plan: Patient agrees to discharge.  Patient goals were partially met. Patient is being discharged due to not returning since the last visit.  ?????        Sigurd Sos, PT 06/07/20 10:07 AM  [facility] [address] [place], [place], [address] Phone: [phone]   Fax:  [phone]  Name: Louis Hellmann  Perkins MRN: [id number] Date of Birth: 04-Oct-1970

## 2020-04-23 ENCOUNTER — Ambulatory Visit: Payer: 59 | Admitting: Physical Therapy

## 2020-04-27 ENCOUNTER — Other Ambulatory Visit: Payer: Self-pay | Admitting: Internal Medicine

## 2020-04-27 DIAGNOSIS — M5136 Other intervertebral disc degeneration, lumbar region: Secondary | ICD-10-CM

## 2020-04-30 ENCOUNTER — Encounter: Payer: 59 | Admitting: Physical Therapy

## 2020-05-04 ENCOUNTER — Ambulatory Visit: Payer: 59

## 2020-05-04 ENCOUNTER — Other Ambulatory Visit: Payer: Self-pay

## 2020-05-04 ENCOUNTER — Ambulatory Visit (AMBULATORY_SURGERY_CENTER): Payer: Self-pay

## 2020-05-04 VITALS — Ht 73.0 in | Wt 214.0 lb

## 2020-05-04 DIAGNOSIS — Z1211 Encounter for screening for malignant neoplasm of colon: Secondary | ICD-10-CM

## 2020-05-04 MED ORDER — PLENVU 140 G PO SOLR: 1.0000 | ORAL | 0 refills | Status: DC

## 2020-05-04 NOTE — Progress Notes (Signed)
No allergies to soy or egg Pt is not on blood thinners or diet pills Denies issues with sedation/intubation Denies atrial flutter/fib Denies constipation   Emmi instructions given to pt  Pt is aware of Covid safety and care partner requirements.  

## 2020-05-07 ENCOUNTER — Encounter: Payer: 59 | Admitting: Physical Therapy

## 2020-05-12 ENCOUNTER — Encounter: Payer: Self-pay | Admitting: Internal Medicine

## 2020-05-14 ENCOUNTER — Encounter: Payer: 59 | Admitting: Physical Therapy

## 2020-05-18 ENCOUNTER — Encounter: Payer: Self-pay | Admitting: Internal Medicine

## 2020-05-18 ENCOUNTER — Other Ambulatory Visit: Payer: Self-pay

## 2020-05-18 ENCOUNTER — Ambulatory Visit (AMBULATORY_SURGERY_CENTER): Payer: 59 | Admitting: Internal Medicine

## 2020-05-18 VITALS — BP 114/80 | HR 73 | Temp 97.3°F | Resp 16 | Ht 73.0 in | Wt 214.0 lb

## 2020-05-18 DIAGNOSIS — Z1211 Encounter for screening for malignant neoplasm of colon: Secondary | ICD-10-CM | POA: Diagnosis not present

## 2020-05-18 MED ORDER — SODIUM CHLORIDE 0.9 % IV SOLN: 500.0000 mL | Freq: Once | INTRAVENOUS | Status: DC

## 2020-05-18 NOTE — Progress Notes (Signed)
VS by CW.  previsit with TM.  No changes to health hx.

## 2020-05-18 NOTE — Patient Instructions (Signed)
YOU HAD AN ENDOSCOPIC PROCEDURE TODAY AT THE [facility]:   Refer to the procedure report that was given to you for any specific questions about what was found during the examination.  If the procedure report does not answer your questions, please call your gastroenterologist to clarify.  If you requested that your care partner not be given the details of your procedure findings, then the procedure report has been included in a sealed envelope for you to review at your convenience later.  YOU SHOULD EXPECT: Some feelings of bloating in the abdomen. Passage of more gas than usual.  Walking can help get rid of the air that was put into your GI tract during the procedure and reduce the bloating. If you had a lower endoscopy (such as a colonoscopy or flexible sigmoidoscopy) you may notice spotting of blood in your stool or on the toilet paper. If you underwent a bowel prep for your procedure, you may not have a normal bowel movement for a few days.  Please Note:  You might notice some irritation and congestion in your nose or some drainage.  This is from the oxygen used during your procedure.  There is no need for concern and it should clear up in a day or so.  SYMPTOMS TO REPORT IMMEDIATELY:   Following lower endoscopy (colonoscopy or flexible sigmoidoscopy):  Excessive amounts of blood in the stool  Significant tenderness or worsening of abdominal pains  Swelling of the abdomen that is new, acute  Fever of 100F or higher  For urgent or emergent issues, a gastroenterologist can be reached at any hour by calling [phone]. Do not use MyChart messaging for urgent concerns.    DIET:  We do recommend a small meal at first, but then you may proceed to your regular diet.  Drink plenty of fluids but you should avoid alcoholic beverages for 24 hours.  ACTIVITY:  You should plan to take it easy for the rest of today and you should NOT DRIVE or use heavy machinery until tomorrow (because  of the sedation medicines used during the test).    FOLLOW UP: Our staff will call the number listed on your records 48-72 hours following your procedure to check on you and address any questions or concerns that you may have regarding the information given to you following your procedure. If we do not reach you, we will leave a message.  We will attempt to reach you two times.  During this call, we will ask if you have developed any symptoms of COVID 19. If you develop any symptoms (ie: fever, flu-like symptoms, shortness of breath, cough etc.) before then, please call [phone].  If you test positive for Covid 19 in the 2 weeks post procedure, please call and report this information to us.    If any biopsies were taken you will be contacted by phone or by letter within the next 1-3 weeks.  Please call us at [phone] if you have not heard about the biopsies in 3 weeks.    SIGNATURES/CONFIDENTIALITY: You and/or your care partner have signed paperwork which will be entered into your electronic medical record.  These signatures attest to the fact that that the information above on your After Visit Summary has been reviewed and is understood.  Full responsibility of the confidentiality of this discharge information lies with you and/or your care-partner. 

## 2020-05-18 NOTE — Op Note (Signed)
[place] Patient Name: Louis Perkins Procedure Date: 05/18/2020 11:09 AM MRN: [id number] Endoscopist: Docia Chuck. Henrene Pastor , MD Age: 50 Referring MD:  Date of Birth: 04-24-1970 Gender: Male Account #: [id number] Procedure:                Colonoscopy Indications:              Screening for colorectal malignant neoplasm Medicines:                Monitored Anesthesia Care Procedure:                Pre-Anesthesia Assessment:                           - Prior to the procedure, a History and Physical                            was performed, and patient medications and                            allergies were reviewed. The patient's tolerance of                            previous anesthesia was also reviewed. The risks                            and benefits of the procedure and the sedation                            options and risks were discussed with the patient.                            All questions were answered, and informed consent                            was obtained. Prior Anticoagulants: The patient has                            taken no previous anticoagulant or antiplatelet                            agents. ASA Grade Assessment: I - A normal, healthy                            patient. After reviewing the risks and benefits,                            the patient was deemed in satisfactory condition to                            undergo the procedure.                           After obtaining informed consent, the colonoscope  was passed under direct vision. Throughout the                            procedure, the patient's blood pressure, pulse, and                            oxygen saturations were monitored continuously. The                            Colonoscope was introduced through the anus and                            advanced to the the cecum, identified by                            appendiceal orifice and ileocecal valve.  The                            ileocecal valve, appendiceal orifice, and rectum                            were photographed. The quality of the bowel                            preparation was excellent. The colonoscopy was                            performed without difficulty. The patient tolerated                            the procedure well. The bowel preparation used was                            Plenvu via split dose instruction. Scope In: 11:20:53 AM Scope Out: 11:35:49 AM Scope Withdrawal Time: 0 hours 10 minutes 28 seconds  Total Procedure Duration: 0 hours 14 minutes 56 seconds  Findings:                 The entire examined colon appeared normal on direct                            and retroflexion views. Complications:            No immediate complications. Estimated blood loss:                            None. Estimated Blood Loss:     Estimated blood loss: none. Impression:               - The entire examined colon is normal on direct and                            retroflexion views.                           - No specimens collected.  Recommendation:           - Repeat colonoscopy in 10 years for screening                            purposes.                           - Patient has a contact number available for                            emergencies. The signs and symptoms of potential                            delayed complications were discussed with the                            patient. Return to normal activities tomorrow.                            Written discharge instructions were provided to the                            patient.                           - Resume previous diet.                           - Continue present medications. Docia Chuck. Henrene Pastor, MD 05/18/2020 11:53:58 AM This report has been signed electronically.

## 2020-05-18 NOTE — Progress Notes (Signed)
PT taken to PACU. Monitors in place. VSS. Report given to RN. 

## 2020-05-20 ENCOUNTER — Telehealth: Payer: Self-pay | Admitting: *Deleted

## 2020-05-20 ENCOUNTER — Telehealth: Payer: Self-pay

## 2020-05-20 NOTE — Telephone Encounter (Signed)
  Follow up Call-  Call back number 05/18/2020  Post procedure Call Back phone  # [phone]  Permission to leave phone message Yes  Some recent data might be hidden    LMOM to call back with any questions or concerns.  Also, call back if patient has developed fever, respiratory issues or been dx with COVID or had any family members or close contacts diagnosed since her procedure.

## 2020-05-20 NOTE — Telephone Encounter (Signed)
Left message on follow up call. 

## 2020-05-23 ENCOUNTER — Other Ambulatory Visit: Payer: Self-pay | Admitting: Internal Medicine

## 2020-05-23 DIAGNOSIS — E559 Vitamin D deficiency, unspecified: Secondary | ICD-10-CM

## 2020-06-15 ENCOUNTER — Other Ambulatory Visit: Payer: Self-pay | Admitting: Internal Medicine

## 2020-06-15 DIAGNOSIS — E559 Vitamin D deficiency, unspecified: Secondary | ICD-10-CM

## 2020-07-12 ENCOUNTER — Other Ambulatory Visit: Payer: Self-pay

## 2020-07-13 ENCOUNTER — Encounter: Payer: Self-pay | Admitting: Internal Medicine

## 2020-07-13 ENCOUNTER — Ambulatory Visit (INDEPENDENT_AMBULATORY_CARE_PROVIDER_SITE_OTHER): Payer: 59 | Admitting: Internal Medicine

## 2020-07-13 VITALS — BP 130/84 | HR 76 | Temp 98.0°F | Ht 73.0 in | Wt 220.1 lb

## 2020-07-13 DIAGNOSIS — M5136 Other intervertebral disc degeneration, lumbar region: Secondary | ICD-10-CM

## 2020-07-13 DIAGNOSIS — G4700 Insomnia, unspecified: Secondary | ICD-10-CM

## 2020-07-13 MED ORDER — TRAZODONE HCL 50 MG PO TABS: 25.0000 mg | ORAL_TABLET | Freq: Every evening | ORAL | 1 refills | Status: DC | PRN

## 2020-07-13 MED ORDER — CYCLOBENZAPRINE HCL 5 MG PO TABS: ORAL_TABLET | ORAL | 1 refills | Status: DC

## 2020-07-13 NOTE — Progress Notes (Signed)
Established Patient Office Visit     This visit occurred during the SARS-CoV-2 public health emergency.  Safety protocols were in place, including screening questions prior to the visit, additional usage of staff PPE, and extensive cleaning of exam room while observing appropriate contact time as indicated for disinfecting solutions.    CC/Reason for Visit: Follow-up insomnia and lumbar back pain with radiculopathy  HPI: Louis Perkins is a 50 y.o. male who is coming in today for the above mentioned reasons. Past Medical History is significant for: Insomnia that has been suboptimally controlled on trazodone as well as now a greater than 12-week history of lower back pain with radiculopathy.  He has been using trazodone for sleep which helps somewhat.  He wonders if he might have sleep apnea.  He has been told that he snores heavily, he sometimes startles awake at nighttime.  He also continues to have low back pain with radiculopathy.  He has had 3 steroid injections in office as well as 1 course of oral prednisone.  He has done physical therapy and was discharged.  He tells me that the lower back pain/aching is always present, about once a week he will have radiculopathy.  His last episode took him out of work for 3 days.   Past Medical/Surgical History: Past Medical History:  Diagnosis Date  . History of chicken pox   . Thyroid disease     Past Surgical History:  Procedure Laterality Date  . ADENOIDECTOMY  1985  . BRAIN SURGERY    . KNEE ARTHROSCOPY W/ ACL RECONSTRUCTION Right 2006   Cadaver graft    Social History:  reports that he has never smoked. He has never used smokeless tobacco. He reports current alcohol use. He reports that he does not use drugs.  Allergies: No Known Allergies  Family History:  Family History  Problem Relation Age of Onset  . Heart disease Father   . Cancer Maternal Uncle        prostate CA  . Arthritis Maternal Grandmother   .  Hypertension Maternal Grandmother   . Cancer Cousin        breast CA  . Colon cancer Neg Hx   . Colon polyps Neg Hx   . Esophageal cancer Neg Hx   . Rectal cancer Neg Hx   . Stomach cancer Neg Hx      Current Outpatient Medications:  .  ARMOUR THYROID 15 MG tablet, Take 15 mg by mouth daily., Disp: , Rfl:  .  ARMOUR THYROID 30 MG tablet, TAKE 1 TABLET BY MOUTH EVERY DAY ON EMPTY STOMACH, Disp: , Rfl: 2 .  Cyanocobalamin (VITAMIN B-12 PO), Take by mouth., Disp: , Rfl:  .  Diclofenac Sodium (PENNSAID) 2 % SOLN, Place 1 application onto the skin 2 (two) times daily., Disp: 1 Bottle, Rfl: 3 .  Multiple Vitamin (MULTI-VITAMIN PO), Take by mouth., Disp: , Rfl:  .  Omega-3 Fatty Acids (FISH OIL PO), Take by mouth., Disp: , Rfl:  .  Vitamin D, Ergocalciferol, (DRISDOL) 1.25 MG (50000 UNIT) CAPS capsule, TAKE 1 CAPSULE (50,000 UNITS TOTAL) BY MOUTH EVERY 7 (SEVEN) DAYS FOR 12 DOSES., Disp: 4 capsule, Rfl: 0 .  cyclobenzaprine (FLEXERIL) 5 MG tablet, TAKE 1 TABLET BY MOUTH AT BEDTIME AS NEEDED FOR MUSCLE SPASMS., Disp: 20 tablet, Rfl: 1 .  traZODone (DESYREL) 50 MG tablet, Take 0.5-1 tablets (25-50 mg total) by mouth at bedtime as needed for sleep., Disp: 30 tablet, Rfl: 1  Review of Systems:  Constitutional: Denies fever, chills, diaphoresis, appetite change and fatigue.  HEENT: Denies photophobia, eye pain, redness, hearing loss, ear pain, congestion, sore throat, rhinorrhea, sneezing, mouth sores, trouble swallowing, neck pain, neck stiffness and tinnitus.   Respiratory: Denies SOB, DOE, cough, chest tightness,  and wheezing.   Cardiovascular: Denies chest pain, palpitations and leg swelling.  Gastrointestinal: Denies nausea, vomiting, abdominal pain, diarrhea, constipation, blood in stool and abdominal distention.  Genitourinary: Denies dysuria, urgency, frequency, hematuria, flank pain and difficulty urinating.  Endocrine: Denies: hot or cold intolerance, sweats, changes in hair or nails,  polyuria, polydipsia. Musculoskeletal: Denies joint swelling, arthralgias and gait problem.  Skin: Denies pallor, rash and wound.  Neurological: Denies dizziness, seizures, syncope, weakness, light-headedness, numbness and headaches.  Hematological: Denies adenopathy. Easy bruising, personal or family bleeding history  Psychiatric/Behavioral: Denies suicidal ideation, mood changes, confusion, nervousness and agitation    Physical Exam: Vitals:   07/13/20 1127  BP: 130/84  Pulse: 76  Temp: 98 F (36.7 C)  TempSrc: Oral  SpO2: 98%  Weight: 220 lb 1.6 oz (99.8 kg)  Height: 6\' 1"  (1.854 m)    Body mass index is 29.04 kg/m.   Constitutional: NAD, calm, comfortable Eyes: PERRL, lids and conjunctivae normal ENMT: Mucous membranes are moist.  Neurologic: CN 2-12 grossly intact. Sensation intact, DTR normal. Strength 5/5 in all 4.  Psychiatric: Normal judgment and insight. Alert and oriented x 3. Normal mood.    Impression and Plan:  Insomnia, unspecified type  -He is requesting refills of trazodone. -I also agree with sleep study, I will go ahead and refer him to neurology for this.  DDD (degenerative disc disease), lumbar -I will refill his Flexeril per request. -Given pain has been present for over 12 weeks, we have tried conservative measures including systemic steroids, physical therapy, back stretches, icing and he has had no relief with significant disability, I believe it is time to order an MRI of the lumbar spine.  Further recommendations to follow.    Lelon Frohlich, MD [facility]

## 2020-07-23 ENCOUNTER — Other Ambulatory Visit: Payer: Self-pay | Admitting: Internal Medicine

## 2020-07-23 DIAGNOSIS — G479 Sleep disorder, unspecified: Secondary | ICD-10-CM

## 2020-08-06 ENCOUNTER — Other Ambulatory Visit: Payer: Self-pay | Admitting: Internal Medicine

## 2020-08-06 DIAGNOSIS — G4700 Insomnia, unspecified: Secondary | ICD-10-CM

## 2020-08-16 ENCOUNTER — Other Ambulatory Visit: Payer: Self-pay

## 2020-08-16 ENCOUNTER — Encounter: Payer: Self-pay | Admitting: Pulmonary Disease

## 2020-08-16 ENCOUNTER — Ambulatory Visit (INDEPENDENT_AMBULATORY_CARE_PROVIDER_SITE_OTHER): Payer: 59 | Admitting: Pulmonary Disease

## 2020-08-16 DIAGNOSIS — G4700 Insomnia, unspecified: Secondary | ICD-10-CM | POA: Diagnosis not present

## 2020-08-16 DIAGNOSIS — R0683 Snoring: Secondary | ICD-10-CM | POA: Insufficient documentation

## 2020-08-16 NOTE — Patient Instructions (Signed)
Home sleep test 

## 2020-08-16 NOTE — Assessment & Plan Note (Signed)
Sleep latency is minimal.  His main issue seems to be nocturnal awakenings which are sometimes prolonged.  Doubt that he needs hypnotic for long-term.  He does not seem to have restless legs syndrome or other reason for nocturnal awakenings

## 2020-08-16 NOTE — Progress Notes (Signed)
Subjective:    Patient ID: Louis Perkins, male    DOB: Jun 03, 1970, 50 y.o.   MRN: [id number]  HPI  Chief Complaint  Patient presents with  . Consult    Referred by PCP for having problems with sleeping well at night as well as snoring. Denies ever having a sleep study before.Denies any family history of sleep disorders.     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 His daughter has noted loud snoring.  He reports numerous awakenings through the night and increased somnolence and tiredness throughout the day. Epworth sleepiness score is 10 and he reports some sleepiness while sitting and reading, lying down to rest in the afternoons and sitting quietly after lunch. Bedtime is between 10 and 11 PM, sleep latency is minimal, sleeps on his left side with 1 pillow, reports 2-3 nocturnal awakenings, spontaneous sometimes for about 20 to 30 minutes, occasional nocturia.  He is out of bed latest by 8 AM feeling tired with occasional headaches that last 1 to 2 hours and he needs a medication for this. He has gained about 10 pounds in the last 2 years There is no history suggestive of cataplexy, sleep paralysis or parasomnias  He was prescribed trazodone few months ago by his PCP for insomnia.  He also takes Flexeril on a as needed basis for back pain      Past Medical History:  Diagnosis Date  . History of chicken pox   . Thyroid disease    Past Surgical History:  Procedure Laterality Date  . ADENOIDECTOMY  1985  . BRAIN SURGERY    . KNEE ARTHROSCOPY W/ ACL RECONSTRUCTION Right 2006   Cadaver graft   Social History   Socioeconomic History  . Marital status: Single    Spouse name: Not on file  . Number of children: Not on file  . Years of education: Not on file  . Highest education level: Not on file  Occupational History  . Not on file  Tobacco Use  . Smoking status: Never Smoker  . Smokeless tobacco: Never Used   Vaping Use  . Vaping Use: Never used  Substance and Sexual Activity  . Alcohol use: Yes  . Drug use: No  . Sexual activity: Not on file  Other Topics Concern  . Not on file  Social History Narrative  . Not on file   Social Determinants of Health   Financial Resource Strain: Not on file  Food Insecurity: Not on file  Transportation Needs: Not on file  Physical Activity: Not on file  Stress: Not on file  Social Connections: Not on file  Intimate Partner Violence: Not on file    Family History  Problem Relation Age of Onset  . Heart disease Father   . Cancer Maternal Uncle        prostate CA  . Arthritis Maternal Grandmother   . Hypertension Maternal Grandmother   . Cancer Cousin        breast CA  . Colon cancer Neg Hx   . Colon polyps Neg Hx   . Esophageal cancer Neg Hx   . Rectal cancer Neg Hx   . Stomach cancer Neg Hx      Review of Systems Constitutional: negative for anorexia, fevers and sweats  Eyes: negative for irritation, redness and visual disturbance  Ears, nose, mouth, throat, and face: negative for earaches, epistaxis, nasal congestion and sore throat  Respiratory: negative for cough, dyspnea on exertion,  sputum and wheezing  Cardiovascular: negative for chest pain, dyspnea, lower extremity edema, orthopnea, palpitations and syncope  Gastrointestinal: negative for abdominal pain, constipation, diarrhea, melena, nausea and vomiting  Genitourinary:negative for dysuria, frequency and hematuria  Hematologic/lymphatic: negative for bleeding, easy bruising and lymphadenopathy  Musculoskeletal:negative for arthralgias, muscle weakness and stiff joints  Neurological: negative for coordination problems, gait problems, headaches and weakness  Endocrine: negative for diabetic symptoms including polydipsia, polyuria and weight loss     Objective:   Physical Exam  Gen. Pleasant, muscular, in no distress, normal affect ENT - no pallor,icterus, no post nasal  drip, class 2 airway Neck: No JVD, no thyromegaly, no carotid bruits Lungs: no use of accessory muscles, no dullness to percussion, decreased without rales or rhonchi  Cardiovascular: Rhythm regular, heart sounds  normal, no murmurs or gallops, no peripheral edema Abdomen: soft and non-tender, no hepatosplenomegaly, BS normal. Musculoskeletal: No deformities, no cyanosis or clubbing Neuro:  alert, non focal, no tremors       Assessment & Plan:

## 2020-08-16 NOTE — Assessment & Plan Note (Signed)
Given excessive daytime somnolence, narrow pharyngeal exam & loud snoring, obstructive sleep apnea is very likely & an overnight polysomnogram will be scheduled as a home study. The pathophysiology of obstructive sleep apnea , it's cardiovascular consequences & modes of treatment including CPAP were discused with the patient in detail & they evidenced understanding.  Pretest probability is high.  He would be willing to use CPAP if needed..  Nasal mask interface should suffice

## 2020-08-19 ENCOUNTER — Other Ambulatory Visit: Payer: Self-pay

## 2020-08-19 ENCOUNTER — Ambulatory Visit
Admission: RE | Admit: 2020-08-19 | Discharge: 2020-08-19 | Disposition: A | Discharge status: Home / Self Care | Payer: 59 | Source: Ambulatory Visit | Attending: Internal Medicine | Admitting: Internal Medicine

## 2020-08-19 DIAGNOSIS — M5136 Other intervertebral disc degeneration, lumbar region: Secondary | ICD-10-CM

## 2020-08-20 ENCOUNTER — Other Ambulatory Visit: Payer: Self-pay | Admitting: Internal Medicine

## 2020-08-20 DIAGNOSIS — M5136 Other intervertebral disc degeneration, lumbar region: Secondary | ICD-10-CM

## 2020-08-24 ENCOUNTER — Encounter: Payer: Self-pay | Admitting: Orthopaedic Surgery

## 2020-08-24 ENCOUNTER — Ambulatory Visit (INDEPENDENT_AMBULATORY_CARE_PROVIDER_SITE_OTHER): Payer: 59 | Admitting: Orthopaedic Surgery

## 2020-08-24 DIAGNOSIS — M48061 Spinal stenosis, lumbar region without neurogenic claudication: Secondary | ICD-10-CM | POA: Diagnosis not present

## 2020-08-24 NOTE — Progress Notes (Signed)
Office Visit Note   Patient: Louis Perkins           Date of Birth: 09-24-1970           MRN: [id number] Visit Date: 08/24/2020              Requested by: Isaac Bliss, Rayford Halsted, MD [place],  [place] [id number] PCP: Isaac Bliss, Rayford Halsted, MD   Assessment & Plan: Visit Diagnoses:  1. Lumbar foraminal stenosis     Plan: We went over patient's MRI scan.  We discussed workout activities that would best be avoided.  We discussed work activities that should not bother his back is much such as stationary bike elliptical on the flat, treadmill.  Straight leg raising.  He will avoid military presses squats, lunges since he will tend to load his back more.  I gave him a copy of the MRI report we reviewed the images discussed pathophysiology of the condition and recommendations.  He will return if she gets increasing symptoms. We discussed problems with larger dose prednisone either IM or p.o. repetitively with muscle loss, hip AVN rest etc.  He should do well with workout modifications.  He will return if he gets progressive radicular symptoms.  He most likely is having problems with the foraminal narrowing on the right at L4-5 and we get to the acute flare and has significant mobility problems and single epidural at that level would likely give him good relief. Follow-Up Instructions: Return in about 3 months (around 11/24/2020).   Orders:  No orders of the defined types were placed in this encounter.  No orders of the defined types were placed in this encounter.     Procedures: No procedures performed   Clinical Data: No additional findings.   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  He usually works out upper extremities.  He said pain over his lower back tends to radiating in  his right leg.  Occasionally is gone all the way to his foot or toes.  He is taken ibuprofen he has had a couple IM steroid shots which helped for 2 weeks then he has some recurrence of symptoms.  MRI scan done 08/19/2020 showed mild to moderate foraminal stenosis right at L3-4 and L4-5.  Moderate left at L4-5 and mild left L3-4.  Patient had some ligamentum thickening and some mild facet degenerative changes.  No associated bowel or bladder symptoms.  Review of Systems previous problems with rotator cuff, some right knee arthritis, Achilles tendinitis hypertension.  All other systems noncontributory to HPI.   Objective: Vital Signs: BP 123/82   Pulse 73   Ht 6\' 1"  (1.854 m)   Wt 218 lb (98.9 kg)   BMI 28.76 kg/m   Physical Exam Constitutional:      Appearance: He is well-developed.  HENT:     Head: Normocephalic and atraumatic.     Right Ear: External ear normal.     Left Ear: External ear normal.  Eyes:     Pupils: Pupils are equal, round, and reactive to light.  Neck:     Thyroid: No thyromegaly.     Trachea: No tracheal deviation.  Cardiovascular:     Rate and Rhythm: Normal rate.  Pulmonary:  Effort: Pulmonary effort is normal.     Breath sounds: No wheezing.  Abdominal:     General: Bowel sounds are normal.     Palpations: Abdomen is soft.  Musculoskeletal:     Cervical back: Neck supple.  Skin:    General: Skin is warm and dry.     Capillary Refill: Capillary refill takes less than 2 seconds.  Neurological:     Mental Status: He is alert and oriented to person, place, and time.  Psychiatric:        Behavior: Behavior normal.        Thought Content: Thought content normal.        Judgment: Judgment normal.    Ortho Exam patient gets easily from sitting to standing he can heel and toe walk no atrophy no weakness.  Gastrocsoleus ankle dorsiflexion plantarflexion is strong negative straight leg raising 90 degrees.  Specialty Comments:  No specialty comments  available.  Imaging: CLINICAL DATA:  Low back pain and right leg weakness since February of 2022. No known injury.   EXAM: MRI LUMBAR SPINE WITHOUT CONTRAST   TECHNIQUE: Multiplanar, multisequence MR imaging of the lumbar spine was performed. No intravenous contrast was administered.   COMPARISON:  X-ray 04/29/2018   FINDINGS: Segmentation:  Standard.   Alignment: 3 mm grade 1 anterolisthesis L4 on L5. Slight lumbar levocurvature.   Vertebrae: No fracture, evidence of discitis, or bone lesion. Inferior endplate Schmorl's nodes of T11 and L5.   Conus medullaris and cauda equina: Conus extends to the L1-2 level. Conus and cauda equina appear normal.   Paraspinal and other soft tissues: Negative.   Disc levels:   T11-T12: Broad-based disc bulge and mild bilateral facet arthropathy resulting in borderline-mild canal stenosis with mild right foraminal stenosis.   T12-L1: No disc protrusion. Unremarkable facet joints. No foraminal or canal stenosis.   L1-L2: No disc protrusion. Unremarkable facet joints. No foraminal or canal stenosis.   L2-L3: No disc protrusion. Unremarkable facet joints. No foraminal or canal stenosis.   L3-L4: Mild circumferential disc bulge with small right foraminal protrusion. Mild bilateral facet arthropathy. Mild-to-moderate right and mild left foraminal stenosis. No significant canal stenosis.   L4-L5: Disc uncovering with mild diffuse disc bulge. Right worse than left facet arthrosis. Mild-to-moderate bilateral foraminal stenosis. No canal stenosis.   L5-S1: Shallow central disc protrusion. Mild bilateral facet arthrosis. Mild bilateral foraminal stenosis. No canal stenosis.   IMPRESSION: 1. Multilevel lower lumbar spondylosis, as described above. 2. Mild-to-moderate right and mild left foraminal stenosis at L3-4. 3. Mild-to-moderate bilateral foraminal stenosis at L4-L5. 4. Mild bilateral foraminal stenosis at L5-S1. 5.  Borderline-mild canal stenosis at T11-T12 with mild right foraminal stenosis.     Electronically Signed   By: Davina Poke D.O.   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[place]) 08/21/2016   History of benign brain tumor 07/04/2013   Hearing loss in right ear 09/05/2011   Rotator cuff impingement syndrome 07/19/2011   Achilles tendonitis 07/19/2011   Family history of early CAD 07/19/2011   Elevated BP 07/19/2011   Past Medical History:  Diagnosis Date   History of chicken  pox    Thyroid disease     Family History  Problem Relation Age of Onset   Heart disease Father    Cancer Maternal Uncle        prostate CA   Arthritis Maternal Grandmother    Hypertension Maternal Grandmother    Cancer Cousin        breast CA   Colon cancer Neg Hx    Colon polyps Neg Hx    Esophageal cancer Neg Hx    Rectal cancer Neg Hx    Stomach cancer Neg Hx     Past Surgical History:  Procedure Laterality Date   ADENOIDECTOMY  1985   BRAIN SURGERY     KNEE ARTHROSCOPY W/ ACL RECONSTRUCTION Right 2006   Cadaver graft   Social History   Occupational History   Not on file  Tobacco Use   Smoking status: Never   Smokeless tobacco: Never  Vaping Use   Vaping Use: Never used  Substance and Sexual Activity   Alcohol use: Yes   Drug use: No   Sexual activity: Not on file

## 2020-09-29 ENCOUNTER — Other Ambulatory Visit: Payer: Self-pay

## 2020-09-29 ENCOUNTER — Telehealth: Payer: Self-pay | Admitting: Pulmonary Disease

## 2020-09-29 DIAGNOSIS — R0683 Snoring: Secondary | ICD-10-CM

## 2020-09-29 NOTE — Telephone Encounter (Signed)
There is no order for a sleep test

## 2020-09-29 NOTE — Telephone Encounter (Signed)
Called and spoke with patient. Advised him that one of the PCC's will contact him. Patient verbalized understanding.    I have placed the home sleep study order.  Nothing further needed at this time.

## 2020-11-04 ENCOUNTER — Telehealth: Payer: Self-pay | Admitting: Pulmonary Disease

## 2020-11-05 NOTE — Telephone Encounter (Signed)
I spoke to the pt & scheduled his hst.  Nothing further needed.

## 2020-11-05 NOTE — Telephone Encounter (Signed)
Louis Perkins has this HST & will call him to schedule.

## 2020-11-11 ENCOUNTER — Ambulatory Visit: Payer: 59

## 2020-11-11 ENCOUNTER — Other Ambulatory Visit: Payer: Self-pay

## 2020-11-11 DIAGNOSIS — G4733 Obstructive sleep apnea (adult) (pediatric): Secondary | ICD-10-CM

## 2020-11-18 ENCOUNTER — Telehealth: Payer: Self-pay | Admitting: Pulmonary Disease

## 2020-11-18 DIAGNOSIS — G4733 Obstructive sleep apnea (adult) (pediatric): Secondary | ICD-10-CM

## 2020-11-18 NOTE — Telephone Encounter (Signed)
Very restless night , changing posiions constantly between back & LT side  HST showed mod  OSA with AHI 15/ hr Perhaps, could try option of wt loss + avoid sleeping on back  But since very symptomatic, would suggest  autoCPAP  5-15 cm, mask of choice OV with me/APP in 6 wks after starting  If he wants to discuss further , can schedule oV with me/ APP

## 2020-11-22 NOTE — Telephone Encounter (Signed)
Patient is returning phone call. Patient phone number is [phone].

## 2020-11-22 NOTE — Telephone Encounter (Signed)
ATC patient, LMTCB 

## 2020-11-23 NOTE — Telephone Encounter (Signed)
Called and spoke with patient to go over HST results. Patient expressed understanding. He would like to proceed with CPAP machine. Order has been placed. Advised him to call us once he physically gets the machine to do a follow up within 31-90 minutes. He expressed understanding. Nothing further needed at this time.

## 2020-11-24 ENCOUNTER — Ambulatory Visit: Payer: 59 | Admitting: Orthopaedic Surgery

## 2020-11-30 ENCOUNTER — Other Ambulatory Visit: Payer: Self-pay

## 2020-12-01 ENCOUNTER — Encounter: Payer: Self-pay | Admitting: Internal Medicine

## 2020-12-01 ENCOUNTER — Ambulatory Visit (INDEPENDENT_AMBULATORY_CARE_PROVIDER_SITE_OTHER): Payer: 59 | Admitting: Internal Medicine

## 2020-12-01 VITALS — BP 118/78 | HR 74 | Temp 98.3°F | Wt 217.7 lb

## 2020-12-01 DIAGNOSIS — G4733 Obstructive sleep apnea (adult) (pediatric): Secondary | ICD-10-CM | POA: Diagnosis not present

## 2020-12-01 DIAGNOSIS — M5441 Lumbago with sciatica, right side: Secondary | ICD-10-CM

## 2020-12-01 DIAGNOSIS — G8929 Other chronic pain: Secondary | ICD-10-CM

## 2020-12-01 NOTE — Progress Notes (Signed)
Established Patient Office Visit     This visit occurred during the SARS-CoV-2 public health emergency.  Safety protocols were in place, including screening questions prior to the visit, additional usage of staff PPE, and extensive cleaning of exam room while observing appropriate contact time as indicated for disinfecting solutions.    CC/Reason for Visit: Follow-up chronic conditions  HPI: Louis Perkins is a 50 y.o. male who is coming in today for the above mentioned reasons.  He is here today to update me on his medical care.  He finally had a sleep study that did show moderate sleep apnea.  He will be getting his CPAP in about 2 weeks.  His back pain is stable, not significantly improved but not worsened either.  He did see Dr. Lorin Mercy who recommended conservative management and to consider epidural injections if pain became severe which it has not.  He would also like some forms filled out for the [place].  Past Medical/Surgical History: Past Medical History:  Diagnosis Date   History of chicken pox    Thyroid disease     Past Surgical History:  Procedure Laterality Date   ADENOIDECTOMY  1985   BRAIN SURGERY     KNEE ARTHROSCOPY W/ ACL RECONSTRUCTION Right 2006   Cadaver graft    Social History:  reports that he has never smoked. He has never used smokeless tobacco. He reports current alcohol use. He reports that he does not use drugs.  Allergies: No Known Allergies  Family History:  Family History  Problem Relation Age of Onset   Heart disease Father    Cancer Maternal Uncle        prostate CA   Arthritis Maternal Grandmother    Hypertension Maternal Grandmother    Cancer Cousin        breast CA   Colon cancer Neg Hx    Colon polyps Neg Hx    Esophageal cancer Neg Hx    Rectal cancer Neg Hx    Stomach cancer Neg Hx      Current Outpatient Medications:    ARMOUR THYROID 30 MG tablet, TAKE 1 TABLET BY MOUTH EVERY DAY ON EMPTY STOMACH, Disp: , Rfl: 2    Cyanocobalamin (VITAMIN B-12 PO), Take by mouth., Disp: , Rfl:    cyclobenzaprine (FLEXERIL) 5 MG tablet, TAKE 1 TABLET BY MOUTH AT BEDTIME AS NEEDED FOR MUSCLE SPASMS., Disp: 20 tablet, Rfl: 1   Diclofenac Sodium (PENNSAID) 2 % SOLN, Place 1 application onto the skin 2 (two) times daily., Disp: 1 Bottle, Rfl: 3   Multiple Vitamin (MULTI-VITAMIN PO), Take by mouth., Disp: , Rfl:    Omega-3 Fatty Acids (FISH OIL PO), Take by mouth., Disp: , Rfl:    traZODone (DESYREL) 50 MG tablet, TAKE 1/2 -1 TABLETS BY MOUTH AT BEDTIME AS NEEDED FOR SLEEP., Disp: 90 tablet, Rfl: 1  Review of Systems:  Constitutional: Denies fever, chills, diaphoresis, appetite change and fatigue.  HEENT: Denies photophobia, eye pain, redness, hearing loss, ear pain, congestion, sore throat, rhinorrhea, sneezing, mouth sores, trouble swallowing, neck pain, neck stiffness and tinnitus.   Respiratory: Denies SOB, DOE, cough, chest tightness,  and wheezing.   Cardiovascular: Denies chest pain, palpitations and leg swelling.  Gastrointestinal: Denies nausea, vomiting, abdominal pain, diarrhea, constipation, blood in stool and abdominal distention.  Genitourinary: Denies dysuria, urgency, frequency, hematuria, flank pain and difficulty urinating.  Endocrine: Denies: hot or cold intolerance, sweats, changes in hair or nails, polyuria, polydipsia. Musculoskeletal: Denies myalgias,  back pain, joint swelling, arthralgias and gait problem.  Skin: Denies pallor, rash and wound.  Neurological: Denies dizziness, seizures, syncope, weakness, light-headedness, numbness and headaches.  Hematological: Denies adenopathy. Easy bruising, personal or family bleeding history  Psychiatric/Behavioral: Denies suicidal ideation, mood changes, confusion, nervousness, sleep disturbance and agitation    Physical Exam: Vitals:   12/01/20 0939  BP: 118/78  Pulse: 74  Temp: 98.3 F (36.8 C)  TempSrc: Oral  SpO2: 98%  Weight: 217 lb 11.2 oz (98.7 kg)     Body mass index is 28.72 kg/m.   Constitutional: NAD, calm, comfortable Eyes: PERRL, lids and conjunctivae normal ENMT: Mucous membranes are moist.  Neurologic: Grossly intact and nonfocal Psychiatric: Normal judgment and insight. Alert and oriented x 3. Normal mood.    Impression and Plan:  OSA (obstructive sleep apnea)  Chronic right-sided low back pain with right-sided sciatica  -Sleep study results have been reviewed, I will make a copy for the chart. -Agree with conservative management for back pain, he will let me know if he would like to reconsider physical therapy in the future. -[facility].  Time spent: 22 minutes reviewing chart, interviewing and examining patient and formulating plan of care.     Lelon Frohlich, MD [facility]

## 2021-02-23 ENCOUNTER — Encounter: Payer: Self-pay | Admitting: Internal Medicine

## 2021-02-23 ENCOUNTER — Ambulatory Visit (INDEPENDENT_AMBULATORY_CARE_PROVIDER_SITE_OTHER): Payer: 59 | Admitting: Internal Medicine

## 2021-02-23 VITALS — BP 120/90 | HR 69 | Temp 98.0°F | Ht 72.5 in | Wt 221.4 lb

## 2021-02-23 DIAGNOSIS — E039 Hypothyroidism, unspecified: Secondary | ICD-10-CM

## 2021-02-23 DIAGNOSIS — Z23 Encounter for immunization: Secondary | ICD-10-CM | POA: Diagnosis not present

## 2021-02-23 DIAGNOSIS — G4733 Obstructive sleep apnea (adult) (pediatric): Secondary | ICD-10-CM

## 2021-02-23 DIAGNOSIS — R030 Elevated blood-pressure reading, without diagnosis of hypertension: Secondary | ICD-10-CM

## 2021-02-23 DIAGNOSIS — G4700 Insomnia, unspecified: Secondary | ICD-10-CM

## 2021-02-23 DIAGNOSIS — Z0000 Encounter for general adult medical examination without abnormal findings: Secondary | ICD-10-CM

## 2021-02-23 DIAGNOSIS — M5136 Other intervertebral disc degeneration, lumbar region: Secondary | ICD-10-CM

## 2021-02-23 LAB — LIPID PANEL
Cholesterol: 272 mg/dL — ABNORMAL HIGH (ref 0–200)
HDL: 83.5 mg/dL (ref >39.00)
LDL Cholesterol: 168 mg/dL — ABNORMAL HIGH (ref 0–99)
NonHDL: 188.76
Total CHOL/HDL Ratio: 3
Triglycerides: 106 mg/dL (ref 0.0–149.0)
VLDL: 21.2 mg/dL (ref 0.0–40.0)

## 2021-02-23 LAB — COMPREHENSIVE METABOLIC PANEL
ALT: 14 U/L (ref 0–53)
AST: 18 U/L (ref 0–37)
Albumin: 4.5 g/dL (ref 3.5–5.2)
Alkaline Phosphatase: 59 U/L (ref 39–117)
BUN: 23 mg/dL (ref 6–23)
CO2: 31 mEq/L (ref 19–32)
Calcium: 10 mg/dL (ref 8.4–10.5)
Chloride: 100 mEq/L (ref 96–112)
Creatinine, Ser: 1.35 mg/dL (ref 0.40–1.50)
GFR: 61.24 mL/min (ref >60.00)
Glucose, Bld: 92 mg/dL (ref 70–99)
Potassium: 4.1 mEq/L (ref 3.5–5.1)
Sodium: 138 mEq/L (ref 135–145)
Total Bilirubin: 0.8 mg/dL (ref 0.2–1.2)
Total Protein: 7.6 g/dL (ref 6.0–8.3)

## 2021-02-23 LAB — PSA: PSA: 1.19 ng/mL (ref 0.10–4.00)

## 2021-02-23 LAB — CBC WITH DIFFERENTIAL/PLATELET
Basophils Absolute: 0 10*3/uL (ref 0.0–0.1)
Basophils Relative: 0.9 % (ref 0.0–3.0)
Eosinophils Absolute: 0.2 10*3/uL (ref 0.0–0.7)
Eosinophils Relative: 6.1 % — ABNORMAL HIGH (ref 0.0–5.0)
HCT: 44.7 % (ref 39.0–52.0)
Hemoglobin: 15 g/dL (ref 13.0–17.0)
Lymphocytes Relative: 31.2 % (ref 12.0–46.0)
Lymphs Abs: 1.3 10*3/uL (ref 0.7–4.0)
MCHC: 33.5 g/dL (ref 30.0–36.0)
MCV: 90.3 fl (ref 78.0–100.0)
Monocytes Absolute: 0.4 10*3/uL (ref 0.1–1.0)
Monocytes Relative: 8.8 % (ref 3.0–12.0)
Neutro Abs: 2.1 10*3/uL (ref 1.4–7.7)
Neutrophils Relative %: 53 % (ref 43.0–77.0)
Platelets: 207 10*3/uL (ref 150.0–400.0)
RBC: 4.94 Mil/uL (ref 4.22–5.81)
RDW: 13.1 % (ref 11.5–15.5)
WBC: 4 10*3/uL (ref 4.0–10.5)

## 2021-02-23 LAB — TSH: TSH: 6.05 u[IU]/mL — ABNORMAL HIGH (ref 0.35–5.50)

## 2021-02-23 LAB — HEMOGLOBIN A1C: Hgb A1c MFr Bld: 5.8 % (ref 4.6–6.5)

## 2021-02-23 LAB — VITAMIN B12: Vitamin B-12: >1550 pg/mL — ABNORMAL HIGH (ref 211–911)

## 2021-02-23 LAB — VITAMIN D 25 HYDROXY (VIT D DEFICIENCY, FRACTURES): VITD: 32.98 ng/mL (ref 30.00–100.00)

## 2021-02-23 MED ORDER — ESZOPICLONE 1 MG PO TABS: 1.0000 mg | ORAL_TABLET | Freq: Every evening | ORAL | 0 refills | Status: DC | PRN

## 2021-02-23 NOTE — Progress Notes (Signed)
Established Patient Office Visit     This visit occurred during the SARS-CoV-2 public health emergency.  Safety protocols were in place, including screening questions prior to the visit, additional usage of staff PPE, and extensive cleaning of exam room while observing appropriate contact time as indicated for disinfecting solutions.    CC/Reason for Visit: Annual preventive exam  HPI: Gerrod Maule is a 50 y.o. male who is coming in today for the above mentioned reasons. Past Medical History is significant for: Hypothyroidism not currently on medication, lumbar spinal stenosis, or complaints of apnea.  He has routine eye and dental care.  He is overdue for flu, COVID booster, Tdap, he had colonoscopy in March.  He has been using trazodone for sleep but it is not helping much and is wondering about transitioning over to [place] at the recommendation of the [place].   Past Medical/Surgical History: Past Medical History:  Diagnosis Date   History of chicken pox    Thyroid disease     Past Surgical History:  Procedure Laterality Date   ADENOIDECTOMY  1985   BRAIN SURGERY     KNEE ARTHROSCOPY W/ ACL RECONSTRUCTION Right 2006   Cadaver graft    Social History:  reports that he has never smoked. He has never used smokeless tobacco. He reports current alcohol use. He reports that he does not use drugs.  Allergies: No Known Allergies  Family History:  Family History  Problem Relation Age of Onset   Heart disease Father    Cancer Maternal Uncle        prostate CA   Arthritis Maternal Grandmother    Hypertension Maternal Grandmother    Cancer Cousin        breast CA   Colon cancer Neg Hx    Colon polyps Neg Hx    Esophageal cancer Neg Hx    Rectal cancer Neg Hx    Stomach cancer Neg Hx      Current Outpatient Medications:    Cyanocobalamin (VITAMIN B-12 PO), Take by mouth., Disp: , Rfl:    cyclobenzaprine (FLEXERIL) 5 MG tablet, TAKE 1 TABLET BY MOUTH AT BEDTIME  AS NEEDED FOR MUSCLE SPASMS., Disp: 20 tablet, Rfl: 1   Diclofenac Sodium (PENNSAID) 2 % SOLN, Place 1 application onto the skin 2 (two) times daily., Disp: 1 Bottle, Rfl: 3   eszopiclone (LUNESTA) 1 MG TABS tablet, Take 1 tablet (1 mg total) by mouth at bedtime as needed for sleep. Take immediately before bedtime, Disp: 30 tablet, Rfl: 0   Multiple Vitamin (MULTI-VITAMIN PO), Take by mouth., Disp: , Rfl:    Omega-3 Fatty Acids (FISH OIL PO), Take by mouth., Disp: , Rfl:    traZODone (DESYREL) 50 MG tablet, TAKE 1/2 -1 TABLETS BY MOUTH AT BEDTIME AS NEEDED FOR SLEEP., Disp: 90 tablet, Rfl: 1  Review of Systems:  Constitutional: Denies fever, chills, diaphoresis, appetite change and fatigue.  HEENT: Denies photophobia, eye pain, redness, hearing loss, ear pain, congestion, sore throat, rhinorrhea, sneezing, mouth sores, trouble swallowing, neck pain, neck stiffness and tinnitus.   Respiratory: Denies SOB, DOE, cough, chest tightness,  and wheezing.   Cardiovascular: Denies chest pain, palpitations and leg swelling.  Gastrointestinal: Denies nausea, vomiting, abdominal pain, diarrhea, constipation, blood in stool and abdominal distention.  Genitourinary: Denies dysuria, urgency, frequency, hematuria, flank pain and difficulty urinating.  Endocrine: Denies: hot or cold intolerance, sweats, changes in hair or nails, polyuria, polydipsia. Musculoskeletal: Denies myalgias, back pain, joint swelling, arthralgias  and gait problem.  Skin: Denies pallor, rash and wound.  Neurological: Denies dizziness, seizures, syncope, weakness, light-headedness, numbness and headaches.  Hematological: Denies adenopathy. Easy bruising, personal or family bleeding history  Psychiatric/Behavioral: Denies suicidal ideation, mood changes, confusion, nervousness, sleep disturbance and agitation    Physical Exam: Vitals:   02/23/21 0746  BP: 120/90  Pulse: 69  Temp: 98 F (36.7 C)  TempSrc: Oral  SpO2: 99%  Weight:  221 lb 6.4 oz (100.4 kg)  Height: 6' 0.5" (1.842 m)    Body mass index is 29.61 kg/m.   Constitutional: NAD, calm, comfortable Eyes: PERRL, lids and conjunctivae normal ENMT: Mucous membranes are moist. Posterior pharynx clear of any exudate or lesions. Normal dentition. Tympanic membrane is pearly white, no erythema or bulging. Neck: normal, supple, no masses, no thyromegaly Respiratory: clear to auscultation bilaterally, no wheezing, no crackles. Normal respiratory effort. No accessory muscle use.  Cardiovascular: Regular rate and rhythm, no murmurs / rubs / gallops. No extremity edema. 2+ pedal pulses. No carotid bruits.  Abdomen: no tenderness, no masses palpated. No hepatosplenomegaly. Bowel sounds positive.  Musculoskeletal: no clubbing / cyanosis. No joint deformity upper and lower extremities. Good ROM, no contractures. Normal muscle tone.  Skin: no rashes, lesions, ulcers. No induration Neurologic: CN 2-12 grossly intact. Sensation intact, DTR normal. Strength 5/5 in all 4.  Psychiatric: Normal judgment and insight. Alert and oriented x 3. Normal mood.    Impression and Plan:  Encounter for preventive health examination -Recommend routine eye and dental care. -Immunizations: Flu and for shingles today, he is also due for COVID booster and Tdap. -Healthy lifestyle discussed in detail. -Labs to be updated today. -Colon cancer screening: March/2022, 10-year callback -Breast cancer screening: Not applicable -Cervical cancer screening: Not applicable -Lung cancer screening: Not applicable -Prostate cancer screening: PSA today -DEXA: Not applicable  Hypothyroidism, unspecified type  -Check TSH, he is not currently on medication.  OSA (obstructive sleep apnea) -Now on CPAP.  Elevated BP without diagnosis of hypertension -Blood pressure today on 2 separate measurements 120/90, 140/100.  He is not known to be hypertensive. -He will do ambulatory blood pressure monitoring  and contact me in about 4 weeks for follow-up.  DDD (degenerative disc disease), lumbar -Noted  Insomnia, unspecified type  - Plan: eszopiclone (LUNESTA) 1 MG TABS tablet  Need for influenza vaccination -Flu vaccine today.  Need for shingles vaccine -First shingles vaccine administered today    Patient Instructions  -Nice seeing you today!!  -Lab work today; will notify you once results are available.  -Start Lunesta 1 mg at bedtime as needed for sleep.  -Check your BP daily and contact me after collecting 2 weeks worth to discuss.  -Flu and first shingles vaccines today.  -Remember COVID booster, tdap vaccines.  -Schedule follow up in 6 months or sooner as needed.      Lelon Frohlich, MD [facility]

## 2021-02-23 NOTE — Patient Instructions (Signed)
-  Nice seeing you today!!  -Lab work today; will notify you once results are available.  -Start Lunesta 1 mg at bedtime as needed for sleep.  -Check your BP daily and contact me after collecting 2 weeks worth to discuss.  -Flu and first shingles vaccines today.  -Remember COVID booster, tdap vaccines.  -Schedule follow up in 6 months or sooner as needed.

## 2021-03-02 ENCOUNTER — Encounter: Payer: Self-pay | Admitting: Internal Medicine

## 2021-03-02 ENCOUNTER — Other Ambulatory Visit: Payer: Self-pay | Admitting: Internal Medicine

## 2021-03-02 DIAGNOSIS — E039 Hypothyroidism, unspecified: Secondary | ICD-10-CM

## 2021-03-02 DIAGNOSIS — E785 Hyperlipidemia, unspecified: Secondary | ICD-10-CM | POA: Insufficient documentation

## 2021-03-02 DIAGNOSIS — R7302 Impaired glucose tolerance (oral): Secondary | ICD-10-CM

## 2021-03-02 DIAGNOSIS — E782 Mixed hyperlipidemia: Secondary | ICD-10-CM

## 2021-03-02 MED ORDER — ATORVASTATIN CALCIUM 20 MG PO TABS
20.0000 mg | ORAL_TABLET | Freq: Every day | ORAL | 1 refills | Status: DC
Start: 2021-03-02 — End: 2021-08-22

## 2021-03-02 MED ORDER — LEVOTHYROXINE SODIUM 25 MCG PO TABS: 25.0000 ug | ORAL_TABLET | Freq: Every day | ORAL | 1 refills | Status: DC

## 2021-04-13 ENCOUNTER — Other Ambulatory Visit (INDEPENDENT_AMBULATORY_CARE_PROVIDER_SITE_OTHER): Payer: 59

## 2021-04-13 DIAGNOSIS — E039 Hypothyroidism, unspecified: Secondary | ICD-10-CM | POA: Diagnosis not present

## 2021-04-13 DIAGNOSIS — E785 Hyperlipidemia, unspecified: Secondary | ICD-10-CM | POA: Diagnosis not present

## 2021-04-13 DIAGNOSIS — R7302 Impaired glucose tolerance (oral): Secondary | ICD-10-CM | POA: Diagnosis not present

## 2021-04-13 LAB — TSH: TSH: 3.65 u[IU]/mL (ref 0.35–5.50)

## 2021-04-13 LAB — LIPID PANEL
Cholesterol: 191 mg/dL (ref 0–200)
HDL: 75 mg/dL (ref >39.00)
LDL Cholesterol: 102 mg/dL — ABNORMAL HIGH (ref 0–99)
NonHDL: 116.47
Total CHOL/HDL Ratio: 3
Triglycerides: 70 mg/dL (ref 0.0–149.0)
VLDL: 14 mg/dL (ref 0.0–40.0)

## 2021-04-13 LAB — HEMOGLOBIN A1C: Hgb A1c MFr Bld: 5.5 % (ref 4.6–6.5)

## 2021-04-14 ENCOUNTER — Telehealth: Payer: Self-pay | Admitting: Pulmonary Disease

## 2021-04-15 NOTE — Telephone Encounter (Signed)
Lm x1 for patient.  

## 2021-04-15 NOTE — Telephone Encounter (Signed)
Appt scheduled 04/20/2021 at 10:30. Patient is aware and voiced his understanding. Nothing further needed.

## 2021-04-15 NOTE — Telephone Encounter (Signed)
Spoke to patient.  Patient stated that he had to return cpap machine yesterday due to non compliance.  He would like to restart cpap. He is aware that repeat sleep study is needed since there has been a break in therapy. He is willing to repeat study.   Dr. Elsworth Soho, please advise. Thanks

## 2021-04-20 ENCOUNTER — Other Ambulatory Visit: Payer: Self-pay

## 2021-04-20 ENCOUNTER — Ambulatory Visit (INDEPENDENT_AMBULATORY_CARE_PROVIDER_SITE_OTHER): Payer: 59 | Admitting: Primary Care

## 2021-04-20 ENCOUNTER — Encounter: Payer: Self-pay | Admitting: Primary Care

## 2021-04-20 DIAGNOSIS — G4733 Obstructive sleep apnea (adult) (pediatric): Secondary | ICD-10-CM | POA: Diagnosis not present

## 2021-04-20 NOTE — Progress Notes (Signed)
@Patient  ID: Louis Perkins, male    DOB: 05/30/1970, 51 y.o.   MRN: [id number]  Chief Complaint  Patient presents with   Follow-up    Pt states that he had a sleep study performed, received a cpap, but states that he had not been compliant and due to not being compliant, DME took CPAP back. Pt is here today to discuss getting CPAP back.    Referring provider: Isaac Bliss, [place]*  HPI: 51 year old male, never smoked.  Past medical history significant for snoring, insomnia, hypothyroidism, hyperlipidemia, MND deficiency.  Patient of Dr. Elsworth Soho, seen for initial consult on 08/16/2020.  04/20/2021 Patient presents today for follow-up.  He had symptoms of daytime somnolence and loud snoring along with narrow pharyngeal exam.  Patient was ordered for home sleep study.  HST showed moderate obstructive sleep apnea, AHI 15/hr. Patient elected to begin auto CPAP 5 to 15 cm h20 in September 2022.  Patient ended up having to return CPAP machine due to noncompliance.  He contacted our office in February as he would like to restart cpap.  Per Dr. Elsworth Soho he will likely need to repeat sleep study as he had a break in therapy.  He received CPAP machine in October 2022, states that he had to travel in November so he was not able to use it and had been having issues finding mask. He was never able to get new mask prior to 90 day period ending for insurance compliance. He does feel like he will be able to be successful if he gets the right mask. He does reports some temporarily improvement in sleep quality with CPAP use. He was not having issues with cpap pressure. He still has symptoms of daytime sleepiness and snoring.   No Known Allergies  Immunization History  Administered Date(s) Administered   Influenza,inj,Quad PF,6+ Mos 12/19/2019, 02/23/2021   PFIZER(Purple Top)SARS-COV-2 Vaccination 05/22/2019, 06/16/2019, 12/19/2019   Zoster Recombinat (Shingrix) 02/23/2021    Past Medical History:   Diagnosis Date   History of chicken pox    Thyroid disease     Tobacco History: Social History   Tobacco Use  Smoking Status Never  Smokeless Tobacco Never   Counseling given: Not Answered   Outpatient Medications Prior to Visit  Medication Sig Dispense Refill   atorvastatin (LIPITOR) 20 MG tablet Take 1 tablet (20 mg total) by mouth daily. 90 tablet 1   Cyanocobalamin (VITAMIN B-12 PO) Take by mouth.     cyclobenzaprine (FLEXERIL) 5 MG tablet TAKE 1 TABLET BY MOUTH AT BEDTIME AS NEEDED FOR MUSCLE SPASMS. 20 tablet 1   Diclofenac Sodium (PENNSAID) 2 % SOLN Place 1 application onto the skin 2 (two) times daily. 1 Bottle 3   eszopiclone (LUNESTA) 1 MG TABS tablet Take 1 tablet (1 mg total) by mouth at bedtime as needed for sleep. Take immediately before bedtime 30 tablet 0   levothyroxine (SYNTHROID) 25 MCG tablet Take 1 tablet (25 mcg total) by mouth daily before breakfast. 90 tablet 1   Multiple Vitamin (MULTI-VITAMIN PO) Take by mouth.     Omega-3 Fatty Acids (FISH OIL PO) Take by mouth.     traZODone (DESYREL) 50 MG tablet TAKE 1/2 -1 TABLETS BY MOUTH AT BEDTIME AS NEEDED FOR SLEEP. 90 tablet 1   No facility-administered medications prior to visit.    Review of Systems  Review of Systems  Constitutional: Negative.   HENT: Negative.    Respiratory: Negative.    Cardiovascular: Negative.   Psychiatric/Behavioral: Negative.  Physical Exam  BP 118/70 (BP Location: Left Arm, Patient Position: Sitting, Cuff Size: Large)    Pulse 67    Temp 98.1 F (36.7 C) (Oral)    Ht 6\' 1"  (1.854 m)    Wt 229 lb 9.6 oz (104.1 kg)    SpO2 98% Comment: RA   BMI 30.29 kg/m  Physical Exam Constitutional:      Appearance: Normal appearance.  HENT:     Head: Normocephalic and atraumatic.  Cardiovascular:     Rate and Rhythm: Normal rate and regular rhythm.  Pulmonary:     Effort: Pulmonary effort is normal.     Breath sounds: Normal breath sounds.  Skin:    General: Skin is warm  and dry.  Neurological:     General: No focal deficit present.     Mental Status: He is alert and oriented to person, place, and time. Mental status is at baseline.  Psychiatric:        Mood and Affect: Mood normal.        Behavior: Behavior normal.        Thought Content: Thought content normal.        Judgment: Judgment normal.     Lab Results:  CBC    Component Value Date/Time   WBC 4.0 02/23/2021 0827   RBC 4.94 02/23/2021 0827   HGB 15.0 02/23/2021 0827   HCT 44.7 02/23/2021 0827   PLT 207.0 02/23/2021 0827   MCV 90.3 02/23/2021 0827   MCHC 33.5 02/23/2021 0827   RDW 13.1 02/23/2021 0827   LYMPHSABS 1.3 02/23/2021 0827   MONOABS 0.4 02/23/2021 0827   EOSABS 0.2 02/23/2021 0827   BASOSABS 0.0 02/23/2021 0827    BMET    Component Value Date/Time   NA 138 02/23/2021 0827   K 4.1 02/23/2021 0827   CL 100 02/23/2021 0827   CO2 31 02/23/2021 0827   GLUCOSE 92 02/23/2021 0827   BUN 23 02/23/2021 0827   CREATININE 1.35 02/23/2021 0827   CALCIUM 10.0 02/23/2021 0827    BNP No results found for: BNP  ProBNP No results found for: PROBNP  Imaging: No results found.   Assessment & Plan:   OSA (obstructive sleep apnea) - Hx moderate sleep apnea. He was started on CPAP therapy in October 2022 but was non-compliant with use and machine was taken back. He would like to be restarted on therapy. He noticed benefit from use but had trouble finding the right fitting mask. No issue with pressure setting. He will need to repeat HST d/t break in therapy. We reviewed risk of untreated sleep apnea including cardiac arrhythmias, stroke, pulm HTN and DM. We also discussed alternative treatment options including weight loss, oral appliance and referral to ENT for possible surgical options. We will follow-up by phone call after HST has resulted.   Recommendations: Focus on side sleeping position Do not drink alcohol in excess prior to bedtime as this can worsen sleep  apnea Maintain normal BMI range  Orders: Home sleep study re: snoring/hx OSA   Follow-up: Call once you get CPAP to schedule follow-up 31-90 days after receiving       Martyn Ehrich, NP 04/21/2021

## 2021-04-20 NOTE — Patient Instructions (Signed)
Recommendations: Focus on side sleeping position Do not drink alcohol in excess prior to bedtime as this can worsen sleep apnea Maintain normal BMI range  Orders: Home sleep study re: snoring/hx OSA   Follow-up: Call once you get CPAP to schedule follow-up 31-90 days after receiving    Sleep Apnea Sleep apnea affects breathing during sleep. It causes breathing to stop for 10 seconds or more, or to become shallow. People with sleep apnea usually snore loudly. It can also increase the risk of: Heart attack. Stroke. Being very overweight (obese). Diabetes. Heart failure. Irregular heartbeat. High blood pressure. The goal of treatment is to help you breathe normally again. What are the causes? The most common cause of this condition is a collapsed or blocked airway. There are three kinds of sleep apnea: Obstructive sleep apnea. This is caused by a blocked or collapsed airway. Central sleep apnea. This happens when the brain does not send the right signals to the muscles that control breathing. Mixed sleep apnea. This is a combination of obstructive and central sleep apnea. What increases the risk? Being overweight. Smoking. Having a small airway. Being older. Being male. Drinking alcohol. Taking medicines to calm yourself (sedatives or tranquilizers). Having family members with the condition. Having a tongue or tonsils that are larger than normal. What are the signs or symptoms? Trouble staying asleep. Loud snoring. Headaches in the morning. Waking up gasping. Dry mouth or sore throat in the morning. Being sleepy or tired during the day. If you are sleepy or tired during the day, you may also: Not be able to focus your mind (concentrate). Forget things. Get angry a lot and have mood swings. Feel sad (depressed). Have changes in your personality. Have less interest in sex, if you are male. Be unable to have an erection, if you are male. How is this  treated?  Sleeping on your side. Using a medicine to get rid of mucus in your nose (decongestant). Avoiding the use of alcohol, medicines to help you relax, or certain pain medicines (narcotics). Losing weight, if needed. Changing your diet. Quitting smoking. Using a machine to open your airway while you sleep, such as: An oral appliance. This is a mouthpiece that shifts your lower jaw forward. A CPAP device. This device blows air through a mask when you breathe out (exhale). An EPAP device. This has valves that you put in each nostril. A BIPAP device. This device blows air through a mask when you breathe in (inhale) and breathe out. Having surgery if other treatments do not work. Follow these instructions at home: Lifestyle Make changes that your doctor recommends. Eat a healthy diet. Lose weight if needed. Avoid alcohol, medicines to help you relax, and some pain medicines. Do not smoke or use any products that contain nicotine or tobacco. If you need help quitting, ask your doctor. General instructions Take over-the-counter and prescription medicines only as told by your doctor. If you were given a machine to use while you sleep, use it only as told by your doctor. If you are having surgery, make sure to tell your doctor you have sleep apnea. You may need to bring your device with you. Keep all follow-up visits. Contact a doctor if: The machine that you were given to use during sleep bothers you or does not seem to be working. You do not get better. You get worse. Get help right away if: Your chest hurts. You have trouble breathing in enough air. You have an uncomfortable feeling in  your back, arms, or stomach. You have trouble talking. One side of your body feels weak. A part of your face is hanging down. These symptoms may be an emergency. Get help right away. Call your local emergency services (911 in the [place] Do not wait to see if the symptoms will go away. Do not  drive yourself to the hospital. Summary This condition affects breathing during sleep. The most common cause is a collapsed or blocked airway. The goal of treatment is to help you breathe normally while you sleep. This information is not intended to replace advice given to you by your health care provider. Make sure you discuss any questions you have with your health care provider. Document Revised: 10/06/2020 Document Reviewed: 02/06/2020 Elsevier Patient Education  2022 [employer].

## 2021-04-21 DIAGNOSIS — G4733 Obstructive sleep apnea (adult) (pediatric): Secondary | ICD-10-CM | POA: Insufficient documentation

## 2021-04-21 NOTE — Assessment & Plan Note (Addendum)
-   Hx moderate sleep apnea. He was started on CPAP therapy in October 2022 but was non-compliant with use and machine was taken back. He would like to be restarted on therapy. He noticed benefit from use but had trouble finding the right fitting mask. No issue with pressure setting. He will need to repeat HST d/t break in therapy. We reviewed risk of untreated sleep apnea including cardiac arrhythmias, stroke, pulm HTN and DM. We also discussed alternative treatment options including weight loss, oral appliance and referral to ENT for possible surgical options. We will follow-up by phone call after HST has resulted.   Recommendations: Focus on side sleeping position Do not drink alcohol in excess prior to bedtime as this can worsen sleep apnea Maintain normal BMI range  Orders: Home sleep study re: snoring/hx OSA   Follow-up: Call once you get CPAP to schedule follow-up 31-90 days after receiving

## 2021-05-31 ENCOUNTER — Other Ambulatory Visit: Payer: 59

## 2021-08-20 ENCOUNTER — Other Ambulatory Visit: Payer: Self-pay | Admitting: Internal Medicine

## 2021-08-20 DIAGNOSIS — E039 Hypothyroidism, unspecified: Secondary | ICD-10-CM

## 2021-08-20 DIAGNOSIS — E782 Mixed hyperlipidemia: Secondary | ICD-10-CM

## 2021-10-27 ENCOUNTER — Ambulatory Visit (INDEPENDENT_AMBULATORY_CARE_PROVIDER_SITE_OTHER): Payer: 59 | Admitting: Internal Medicine

## 2021-10-27 VITALS — BP 142/82 | HR 76 | Temp 98.0°F | Wt 237.8 lb

## 2021-10-27 DIAGNOSIS — I10 Essential (primary) hypertension: Secondary | ICD-10-CM

## 2021-10-27 DIAGNOSIS — G4700 Insomnia, unspecified: Secondary | ICD-10-CM | POA: Diagnosis not present

## 2021-10-27 DIAGNOSIS — M542 Cervicalgia: Secondary | ICD-10-CM

## 2021-10-27 DIAGNOSIS — Z23 Encounter for immunization: Secondary | ICD-10-CM | POA: Diagnosis not present

## 2021-10-27 DIAGNOSIS — G4733 Obstructive sleep apnea (adult) (pediatric): Secondary | ICD-10-CM

## 2021-10-27 DIAGNOSIS — M5412 Radiculopathy, cervical region: Secondary | ICD-10-CM

## 2021-10-27 MED ORDER — AMLODIPINE BESYLATE 5 MG PO TABS: 5.0000 mg | ORAL_TABLET | Freq: Every day | ORAL | 1 refills | Status: DC

## 2021-10-27 NOTE — Patient Instructions (Signed)
-  Nice seeing you today!!  -Start amlodipine 5 mg daily.  -Final shingles vaccine today.  -Schedule follow up in 6 weeks for your blood pressure.

## 2021-10-27 NOTE — Progress Notes (Signed)
Established Patient Office Visit     CC/Reason for Visit: Follow-up chronic conditions, discuss acute concerns  HPI: Louis Perkins is a 51 y.o. male who is coming in today for the above mentioned reasons. Past Medical History is significant for: Hypothyroidism, lumbar spinal stenosis and obstructive sleep apnea.  He was never adequately fitted with CPAP and is still having trouble sleeping. He [place] having scheduling conflict with Pulmonary and would like to be referred someplace else for treatment of his OSA. He also takes Lunesta 1 mg at a time.  He was noticed at last visit to have an elevated blood pressure and states that at home his systolics have been in the 1 40-1 50 range.  He is due for second shingles vaccine.  He has also been experiencing neck soreness with bilateral arm and fingertip numbness and tingling.   Past Medical/Surgical History: Past Medical History:  Diagnosis Date   History of chicken pox    Thyroid disease     Past Surgical History:  Procedure Laterality Date   ADENOIDECTOMY  1985   BRAIN SURGERY     KNEE ARTHROSCOPY W/ ACL RECONSTRUCTION Right 2006   Cadaver graft    Social History:  reports that he has never smoked. He has never used smokeless tobacco. He reports current alcohol use. He reports that he does not use drugs.  Allergies: No Known Allergies  Family History:  Family History  Problem Relation Age of Onset   Heart disease Father    Cancer Maternal Uncle        prostate CA   Arthritis Maternal Grandmother    Hypertension Maternal Grandmother    Cancer Cousin        breast CA   Colon cancer Neg Hx    Colon polyps Neg Hx    Esophageal cancer Neg Hx    Rectal cancer Neg Hx    Stomach cancer Neg Hx      Current Outpatient Medications:    amLODipine (NORVASC) 5 MG tablet, Take 1 tablet (5 mg total) by mouth daily., Disp: 90 tablet, Rfl: 1   atorvastatin (LIPITOR) 20 MG tablet, TAKE 1 TABLET BY MOUTH EVERY DAY, Disp: 90  tablet, Rfl: 1   Cyanocobalamin (VITAMIN B-12 PO), Take by mouth., Disp: , Rfl:    cyclobenzaprine (FLEXERIL) 5 MG tablet, TAKE 1 TABLET BY MOUTH AT BEDTIME AS NEEDED FOR MUSCLE SPASMS., Disp: 20 tablet, Rfl: 1   Diclofenac Sodium (PENNSAID) 2 % SOLN, Place 1 application onto the skin 2 (two) times daily., Disp: 1 Bottle, Rfl: 3   eszopiclone (LUNESTA) 1 MG TABS tablet, Take 1 tablet (1 mg total) by mouth at bedtime as needed for sleep. Take immediately before bedtime, Disp: 30 tablet, Rfl: 0   levothyroxine (SYNTHROID) 25 MCG tablet, TAKE 1 TABLET BY MOUTH DAILY BEFORE BREAKFAST., Disp: 90 tablet, Rfl: 1   Multiple Vitamin (MULTI-VITAMIN PO), Take by mouth., Disp: , Rfl:    Omega-3 Fatty Acids (FISH OIL PO), Take by mouth., Disp: , Rfl:    traZODone (DESYREL) 50 MG tablet, TAKE 1/2 -1 TABLETS BY MOUTH AT BEDTIME AS NEEDED FOR SLEEP., Disp: 90 tablet, Rfl: 1  Review of Systems:  Constitutional: Denies fever, chills, diaphoresis, appetite change and fatigue.  HEENT: Denies photophobia, eye pain, redness, hearing loss, ear pain, congestion, sore throat, rhinorrhea, sneezing, mouth sores, trouble swallowing, neck pain, neck stiffness and tinnitus.   Respiratory: Denies SOB, DOE, cough, chest tightness,  and wheezing.  Cardiovascular: Denies chest pain, palpitations and leg swelling.  Gastrointestinal: Denies nausea, vomiting, abdominal pain, diarrhea, constipation, blood in stool and abdominal distention.  Genitourinary: Denies dysuria, urgency, frequency, hematuria, flank pain and difficulty urinating.  Endocrine: Denies: hot or cold intolerance, sweats, changes in hair or nails, polyuria, polydipsia. Musculoskeletal: Denies myalgias, back pain, joint swelling, arthralgias and gait problem.  Skin: Denies pallor, rash and wound.  Neurological: Denies dizziness, seizures, syncope, weakness, light-headedness, numbness and headaches.  Hematological: Denies adenopathy. Easy bruising, personal or  family bleeding history  Psychiatric/Behavioral: Denies suicidal ideation, mood changes, confusion, nervousness, sleep disturbance and agitation    Physical Exam: Vitals:   10/27/21 1100 10/27/21 1103  BP: (!) 140/90 (!) 142/82  Pulse: 76   Temp: 98 F (36.7 C)   TempSrc: Oral   SpO2: 98%   Weight: 237 lb 12.8 oz (107.9 kg)     Body mass index is 31.37 kg/m.   Constitutional: NAD, calm, comfortable Eyes: PERRL, lids and conjunctivae normal ENMT: Mucous membranes are moist.  Respiratory: clear to auscultation bilaterally, no wheezing, no crackles. Normal respiratory effort. No accessory muscle use.  Cardiovascular: Regular rate and rhythm, no murmurs / rubs / gallops. No extremity edema.  Psychiatric: Normal judgment and insight. Alert and oriented x 3. Normal mood.    Impression and Plan:  Primary hypertension  - Plan: amLODipine (NORVASC) 5 MG tablet -I will make this diagnosis today, start amlodipine 5 mg and return in 6 to 8 weeks for follow-up.  Insomnia, unspecified type OSA (obstructive sleep apnea)  - Plan: Ambulatory referral to Neurology for sleep medicine, needs to be fitted for CPAP, suspect his ongoing insomnia is due to untreated OSA.  Need for shingles vaccine  - Plan: Zoster Recombinant (Shingrix )  Cervical radiculopathy Cervicalgia  - Plan: MR Cervical Spine Wo Contrast    Time spent:31 minutes reviewing chart, interviewing and examining patient and formulating plan of care.   Patient Instructions  -Nice seeing you today!!  -Start amlodipine 5 mg daily.  -Final shingles vaccine today.  -Schedule follow up in 6 weeks for your blood pressure.   Lelon Frohlich, MD [facility]

## 2021-11-01 ENCOUNTER — Other Ambulatory Visit: Payer: Self-pay | Admitting: Internal Medicine

## 2021-11-01 DIAGNOSIS — F40240 Claustrophobia: Secondary | ICD-10-CM

## 2021-11-01 MED ORDER — ALPRAZOLAM 0.5 MG PO TABS: 0.5000 mg | ORAL_TABLET | Freq: Once | ORAL | 0 refills | Status: AC

## 2021-11-03 ENCOUNTER — Other Ambulatory Visit: Payer: Self-pay | Admitting: Internal Medicine

## 2021-11-03 DIAGNOSIS — M542 Cervicalgia: Secondary | ICD-10-CM

## 2021-11-04 ENCOUNTER — Ambulatory Visit (HOSPITAL_BASED_OUTPATIENT_CLINIC_OR_DEPARTMENT_OTHER): Payer: 59

## 2021-11-21 ENCOUNTER — Other Ambulatory Visit: Payer: 59

## 2021-11-22 ENCOUNTER — Ambulatory Visit
Admission: RE | Admit: 2021-11-22 | Discharge: 2021-11-22 | Disposition: A | Discharge status: Home / Self Care | Payer: 59 | Source: Ambulatory Visit | Attending: Internal Medicine | Admitting: Internal Medicine

## 2021-11-22 DIAGNOSIS — M542 Cervicalgia: Secondary | ICD-10-CM

## 2021-11-23 ENCOUNTER — Other Ambulatory Visit: Payer: Self-pay | Admitting: Internal Medicine

## 2021-11-23 DIAGNOSIS — M542 Cervicalgia: Secondary | ICD-10-CM

## 2021-11-23 DIAGNOSIS — R9389 Abnormal findings on diagnostic imaging of other specified body structures: Secondary | ICD-10-CM

## 2022-01-18 ENCOUNTER — Other Ambulatory Visit: Payer: Self-pay

## 2022-01-18 ENCOUNTER — Ambulatory Visit: Payer: 59 | Attending: Neurosurgery

## 2022-01-18 DIAGNOSIS — M542 Cervicalgia: Secondary | ICD-10-CM | POA: Insufficient documentation

## 2022-01-18 DIAGNOSIS — M6281 Muscle weakness (generalized): Secondary | ICD-10-CM | POA: Insufficient documentation

## 2022-01-18 DIAGNOSIS — R252 Cramp and spasm: Secondary | ICD-10-CM

## 2022-01-18 NOTE — Therapy (Addendum)
OUTPATIENT PHYSICAL THERAPY CERVICAL EVALUATION   Patient Name: Louis Perkins MRN: [id number] DOB:[birth year], 51 y.o., male Today's Date: 01/18/2022   PT End of Session - 01/18/22 1057     Visit Number 1    Date for PT Re-Evaluation 03/15/22    Authorization Type [employer]    PT Start Time 1050    PT Stop Time 3295    PT Time Calculation (min) 55 min    Activity Tolerance Patient tolerated treatment well    Behavior During Therapy WFL for tasks assessed/performed             Past Medical History:  Diagnosis Date   History of chicken pox    Thyroid disease    Past Surgical History:  Procedure Laterality Date   ADENOIDECTOMY  1985   BRAIN SURGERY     KNEE ARTHROSCOPY W/ ACL RECONSTRUCTION Right 2006   Cadaver graft   Patient Active Problem List   Diagnosis Date Noted   Cervicalgia 01/18/2022   Hypertension 10/27/2021   OSA (obstructive sleep apnea) 04/21/2021   IGT (impaired glucose tolerance) 03/02/2021   Hyperlipidemia 03/02/2021   Lumbar foraminal stenosis 08/24/2020   Loud snoring 08/16/2020   Vitamin D deficiency 02/20/2020   Hypothyroidism 02/19/2020   DDD (degenerative disc disease), lumbar 02/19/2020   Insomnia 02/19/2020   Primary osteoarthritis of right knee 01/14/2018   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[place]) 08/21/2016   History of benign brain tumor 07/04/2013   Hearing loss in right ear 09/05/2011   Rotator cuff impingement syndrome 07/19/2011   Achilles tendonitis 07/19/2011   Family history of early CAD 07/19/2011   Elevated BP without diagnosis of hypertension 07/19/2011    PCP: Isaac Bliss, Rayford Halsted, MD   REFERRING PROVIDER: Newman Pies, MD  REFERRING DIAG: M54.2 (ICD-10-CM) - Cervicalgia  THERAPY DIAG:  Cervicalgia  Cramp and spasm  Muscle weakness (generalized)  Rationale for Evaluation and Treatment: Rehabilitation  ONSET DATE: 12/26/2021  SUBJECTIVE:                                                                                                                                                                                                          SUBJECTIVE STATEMENT: Patient reports several weeks of neck pain along with bilateral UE radicular symptoms.  No history of any injuries.  He played sports through high school and works out consistently primarily lifting.  He denies any strength issues.  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  He hopes to  eliminate radicular symptoms and avoid surgery or injections and be able to continue his workouts and have pain free functional use of both shoulders.    PERTINENT HISTORY:  na  PAIN:  Are you having pain? Yes: NPRS scale: 4/10 Pain location: bilateral UE's Pain description: tingling Aggravating factors: overhead lifting Relieving factors: rest  PRECAUTIONS: None  WEIGHT BEARING RESTRICTIONS: No  FALLS:  Has patient fallen in last 6 months? No  LIVING ENVIRONMENT: Lives with: lives with their family Lives in: House/apartment   OCCUPATION: Sports coach  PLOF: Independent, Independent with basic ADLs, Independent with household mobility without device, Independent with community mobility without device, Independent with homemaking with ambulation, Independent with gait, and Independent with transfers  PATIENT GOALS: He hopes to eliminate radicular symptoms and avoid surgery or injections and be able to continue his workouts and have pain free functional use of both shoulders.   NEXT MD VISIT:   OBJECTIVE:   DIAGNOSTIC FINDINGS:  IMPRESSION: 1. At C5-6 there is a broad-based disc bulge with a shallow broad right paracentral disc protrusion. Moderate right foraminal stenosis. No left foraminal stenosis. Mild spinal stenosis. 2. At C6-7 there is mild bilateral foraminal narrowing. 3. At C7-T1 there is a tiny right paracentral disc protrusion. Mild bilateral foraminal  stenosis.  PATIENT SURVEYS:  FOTO complete next visit  COGNITION: Overall cognitive status: Within functional limits for tasks assessed  SENSATION: Intermittant radicular symptoms  POSTURE: rounded shoulders  PALPATION: Trigger points and tight bands bilateral upper traps and parascapular areas.     CERVICAL ROM:   Active ROM A/PROM (deg) eval  Flexion WFL  Extension WFL  Right lateral flexion 25  Left lateral flexion 30  Right rotation 45  Left rotation 45   (Blank rows = not tested)  UPPER EXTREMITY ROM:  WFL  (Blank rows = not tested)  UPPER EXTREMITY MMT:  All generally 4+ to 5/5   TODAY'S TREATMENT:                                                                                                                              DATE: 01/18/22 Initial eval completed, initiated HEP,  traction 20 lbs, 2 steps, 5 sec hold between steps, 100% speed, 60 sec hold at max   PATIENT EDUCATION:  Education details: Initiated HEP,  educate on DN and provided handout Person educated: Patient Education method: Explanation, Demonstration, Verbal cues, and Handouts Education comprehension: verbalized understanding, returned demonstration, and verbal cues required  HOME EXERCISE PROGRAM: Access Code: [email or web address] URL: [email or web address] Date: 01/18/2022 Prepared by: Candyce Churn  Exercises - Prone Shoulder W on [employer]  - 1 x daily - 7 x weekly - 1 sets - 10 reps - Prone Shoulder Flexion on Swiss Ball with Dumbbell  - 1 x daily - 7 x weekly - 1 sets - 10 reps - Prone Shoulder Horizontal Abduction on Swiss Ball  - 1 x daily - 7 x weekly - 1  sets - 10 reps - Prone Shoulder Horizontal Abduction on Swiss Ball  - 1 x daily - 7 x weekly - 1 sets - 10 reps - Prone Lower Trapezius Strengthening on Swiss Ball  - 1 x daily - 7 x weekly - 1 sets - 10 reps  ASSESSMENT:  CLINICAL IMPRESSION: Patient is a 51 y.o. male who was seen today for physical therapy evaluation  and treatment for neck pain due to disc bulges (paracentral) with radicular symptoms.  He lifts on a regular basis and denies any significant issues with his shoulders but did recall a right shoulder issues many years back.  He presents with decreased cervical ROM, but generally good strength throughout bilateral Upper extremities.  Did not do any special tests as MRI confirms paracentral disc protrusion and stenosis.  He would benefit from postural strengthening along with cervical traction and possibly dry needling to address trigger points in bilateral upper traps, levator and cervical multifidi.     OBJECTIVE IMPAIRMENTS: decreased ROM, decreased strength, increased fascial restrictions, increased muscle spasms, and impaired flexibility.   ACTIVITY LIMITATIONS: carrying, lifting, reach over head, and hygiene/grooming  PARTICIPATION LIMITATIONS: occupation, yard work, and workouts  PERSONAL FACTORS: Past/current experiences and Time since onset of injury/illness/exacerbation are also affecting patient's functional outcome.   REHAB POTENTIAL: Good  CLINICAL DECISION MAKING: Stable/uncomplicated  EVALUATION COMPLEXITY: Low   GOALS: Goals reviewed with patient? Yes  SHORT TERM GOALS: Target date: 02/15/2022   Patient will be independent with initial HEP  Baseline: na Goal status: INITIAL  2.  Pain report to be no greater than 4/10 with activity Baseline: na Goal status: INITIAL   LONG TERM GOALS: Target date: 03/15/2022  Patient to be independent with advanced HEP  Baseline: na Goal status: INITIAL  2.  Patient to report pain no greater than 2/10  Baseline: na Goal status: INITIAL  3.  Patient to be able to reach overhead without onset of radicular symptoms Baseline: na Goal status: INITIAL  4.  Patient to be able to do his usual lifting workout without increased arm symptoms Baseline: na Goal status: INITIAL  5.  Patient to be able to avoid injections or  surgery Baseline: na Goal status: INITIAL     PLAN:  PT FREQUENCY: 2x/week  PT DURATION: 8 weeks  PLANNED INTERVENTIONS: Therapeutic exercises, Therapeutic activity, Neuromuscular re-education, Balance training, Gait training, Patient/Family education, Self Care, Joint mobilization, Aquatic Therapy, Dry Needling, Electrical stimulation, Spinal mobilization, Cryotherapy, Moist heat, Taping, Vasopneumatic device, Traction, Ultrasound, Ionotophoresis '4mg'$ /ml Dexamethasone, Manual therapy, and Re-evaluation  PLAN FOR NEXT SESSION: Traction, UBE, rotator cuff and parascapular strengthening, dry needling to upper trap infraspinatus if patient agrees.     Anderson Malta B. Tyvon Eggenberger, PT 01/24/22 12:21 PM  [facility] [address] [place], [place] [address] Phone # [phone] Fax [phone]

## 2022-01-24 ENCOUNTER — Ambulatory Visit: Payer: 59

## 2022-01-24 DIAGNOSIS — M6281 Muscle weakness (generalized): Secondary | ICD-10-CM

## 2022-01-24 DIAGNOSIS — R252 Cramp and spasm: Secondary | ICD-10-CM

## 2022-01-24 DIAGNOSIS — M542 Cervicalgia: Secondary | ICD-10-CM

## 2022-01-24 NOTE — Therapy (Signed)
OUTPATIENT PHYSICAL THERAPY TREATMENT    Patient Name: Louis Perkins MRN: [id number] DOB:03-16-1970, 51 y.o., male Today's Date: 01/24/2022   PT End of Session - 01/24/22 1001     Visit Number 2    Date for PT Re-Evaluation 03/15/22    Authorization Type [employer]    PT Start Time 0932    PT Stop Time 1013    PT Time Calculation (min) 41 min    Activity Tolerance Patient tolerated treatment well    Behavior During Therapy WFL for tasks assessed/performed              Past Medical History:  Diagnosis Date   History of chicken pox    Thyroid disease    Past Surgical History:  Procedure Laterality Date   ADENOIDECTOMY  1985   BRAIN SURGERY     KNEE ARTHROSCOPY W/ ACL RECONSTRUCTION Right 2006   Cadaver graft   Patient Active Problem List   Diagnosis Date Noted   Cervicalgia 01/18/2022   Hypertension 10/27/2021   OSA (obstructive sleep apnea) 04/21/2021   IGT (impaired glucose tolerance) 03/02/2021   Hyperlipidemia 03/02/2021   Lumbar foraminal stenosis 08/24/2020   Loud snoring 08/16/2020   Vitamin D deficiency 02/20/2020   Hypothyroidism 02/19/2020   DDD (degenerative disc disease), lumbar 02/19/2020   Insomnia 02/19/2020   Primary osteoarthritis of right knee 01/14/2018   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[place]) 08/21/2016   History of benign brain tumor 07/04/2013   Hearing loss in right ear 09/05/2011   Rotator cuff impingement syndrome 07/19/2011   Achilles tendonitis 07/19/2011   Family history of early CAD 07/19/2011   Elevated BP without diagnosis of hypertension 07/19/2011    PCP: Isaac Bliss, Rayford Halsted, MD   REFERRING PROVIDER: Newman Pies, MD  REFERRING DIAG: M54.2 (ICD-10-CM) - Cervicalgia  THERAPY DIAG:  Cervicalgia  Cramp and spasm  Muscle weakness (generalized)  Rationale for Evaluation and Treatment: Rehabilitation  ONSET DATE: 12/26/2021  SUBJECTIVE:                                                                                                                                                                                                          SUBJECTIVE STATEMENT: My pain was better after traction last time.  My neck overall was better but I woke up with pain this morning on the left side of my neck.    PERTINENT HISTORY:  na  PAIN:  Are you having pain? Yes: NPRS scale: 7/10 Pain location: bilateral UE's Pain description: tingling Aggravating factors: overhead lifting  Relieving factors: rest  PRECAUTIONS: None  WEIGHT BEARING RESTRICTIONS: No  FALLS:  Has patient fallen in last 6 months? No  LIVING ENVIRONMENT: Lives with: lives with their family Lives in: House/apartment   OCCUPATION: Sports coach  PLOF: Independent, Independent with basic ADLs, Independent with household mobility without device, Independent with community mobility without device, Independent with homemaking with ambulation, Independent with gait, and Independent with transfers  PATIENT GOALS: He hopes to eliminate radicular symptoms and avoid surgery or injections and be able to continue his workouts and have pain free functional use of both shoulders.   NEXT MD VISIT:   OBJECTIVE:   DIAGNOSTIC FINDINGS:  IMPRESSION: 1. At C5-6 there is a broad-based disc bulge with a shallow broad right paracentral disc protrusion. Moderate right foraminal stenosis. No left foraminal stenosis. Mild spinal stenosis. 2. At C6-7 there is mild bilateral foraminal narrowing. 3. At C7-T1 there is a tiny right paracentral disc protrusion. Mild bilateral foraminal stenosis.  PATIENT SURVEYS:  FOTO complete next visit  COGNITION: Overall cognitive status: Within functional limits for tasks assessed  SENSATION: Intermittant radicular symptoms  POSTURE: rounded shoulders  PALPATION: Trigger points and tight bands bilateral upper traps and parascapular areas.     CERVICAL ROM:   Active ROM A/PROM  (deg) eval  Flexion WFL  Extension WFL  Right lateral flexion 25  Left lateral flexion 30  Right rotation 45  Left rotation 45   (Blank rows = not tested)  UPPER EXTREMITY ROM:  WFL  (Blank rows = not tested)  UPPER EXTREMITY MMT:  All generally 4+ to 5/5  TODAY'S TREATMENT:                                                                                                                     DATE: 01/18/22 Arm bike: Level 1.7 x 6 minutes (3/3)- PT present to discuss progress traction 20 lbs, 2 steps, 5 sec hold between steps, 100% speed, 60 sec hold at max  Prone over blue ball: W, T, flexion, Y x 10 each Cervical rotation and sidebending 3x20 seconds  Levator stretch 3x20 seconds  Open book: x 10 each Cervical mechanical traction: 22#/8# 60 seconds/10 seconds x15 minutes   TODAY'S TREATMENT:                                                                                                          DATE: 01/18/22 Initial eval completed, initiated HEP,   traction 20 lbs, 2 steps, 5 sec hold between steps, 100% speed, 60 sec hold at max  PATIENT EDUCATION:  Education details: Initiated HEP,  educate on DN and provided handout Person educated: Patient Education method: Explanation, Demonstration, Verbal cues, and Handouts Education comprehension: verbalized understanding, returned demonstration, and verbal cues required  HOME EXERCISE PROGRAM: Access Code: [email or web address] URL: [email or web address] Date: 01/18/2022 Prepared by: Candyce Churn  Exercises - Prone Shoulder W on [employer]  - 1 x daily - 7 x weekly - 1 sets - 10 reps - Prone Shoulder Flexion on Swiss Ball with Dumbbell  - 1 x daily - 7 x weekly - 1 sets - 10 reps - Prone Shoulder Horizontal Abduction on Swiss Ball  - 1 x daily - 7 x weekly - 1 sets - 10 reps - Prone Shoulder Horizontal Abduction on Swiss Ball  - 1 x daily - 7 x weekly - 1 sets - 10 reps - Prone Lower Trapezius Strengthening on Swiss Ball  -  1 x daily - 7 x weekly - 1 sets - 10 reps  ASSESSMENT:  CLINICAL IMPRESSION: First time follow-up after evaluation.  Pt was able to demonstrate all aspects of prone exercises well and PT provided minor tactile and demo cues to reduce upper trap activation.  Pt responded well to traction last session with relief until this morning.  Pt is working on making postural adjustments at home and required intermittent cues throughout session.  PT discussed DN to neck with pt and he is going to think about it for next session.  Patient will benefit from skilled PT to address the below impairments and improve overall function.   OBJECTIVE IMPAIRMENTS: decreased ROM, decreased strength, increased fascial restrictions, increased muscle spasms, and impaired flexibility.   ACTIVITY LIMITATIONS: carrying, lifting, reach over head, and hygiene/grooming  PARTICIPATION LIMITATIONS: occupation, yard work, and workouts  PERSONAL FACTORS: Past/current experiences and Time since onset of injury/illness/exacerbation are also affecting patient's functional outcome.   REHAB POTENTIAL: Good  CLINICAL DECISION MAKING: Stable/uncomplicated  EVALUATION COMPLEXITY: Low   GOALS: Goals reviewed with patient? Yes  SHORT TERM GOALS: Target date: 02/15/2022   Patient will be independent with initial HEP  Baseline: na Goal status: INITIAL  2.  Pain report to be no greater than 4/10 with activity Baseline: na Goal status: INITIAL   LONG TERM GOALS: Target date: 03/15/2022  Patient to be independent with advanced HEP  Baseline: na Goal status: INITIAL  2.  Patient to report pain no greater than 2/10  Baseline: na Goal status: INITIAL  3.  Patient to be able to reach overhead without onset of radicular symptoms Baseline: na Goal status: INITIAL  4.  Patient to be able to do his usual lifting workout without increased arm symptoms Baseline: na Goal status: INITIAL  5.  Patient to be able to avoid  injections or surgery Baseline: na Goal status: INITIAL     PLAN:  PT FREQUENCY: 2x/week  PT DURATION: 8 weeks  PLANNED INTERVENTIONS: Therapeutic exercises, Therapeutic activity, Neuromuscular re-education, Balance training, Gait training, Patient/Family education, Self Care, Joint mobilization, Aquatic Therapy, Dry Needling, Electrical stimulation, Spinal mobilization, Cryotherapy, Moist heat, Taping, Vasopneumatic device, Traction, Ultrasound, Ionotophoresis '4mg'$ /ml Dexamethasone, Manual therapy, and Re-evaluation  PLAN FOR NEXT SESSION: DN to neck and Lt shoulder if pt agrees, continue traction, postural and shoulder strength, thoracic and cervical flexibility.   Sigurd Sos, PT 01/24/22 10:05 AM  [facility] [address] [place], [place] [address] Phone # [phone] Fax [phone]

## 2022-01-30 ENCOUNTER — Ambulatory Visit: Payer: 59

## 2022-02-01 ENCOUNTER — Ambulatory Visit: Payer: 59

## 2022-02-08 ENCOUNTER — Ambulatory Visit: Payer: 59

## 2022-02-08 DIAGNOSIS — M6281 Muscle weakness (generalized): Secondary | ICD-10-CM

## 2022-02-08 DIAGNOSIS — R252 Cramp and spasm: Secondary | ICD-10-CM

## 2022-02-08 DIAGNOSIS — M542 Cervicalgia: Secondary | ICD-10-CM | POA: Diagnosis not present

## 2022-02-08 NOTE — Therapy (Signed)
OUTPATIENT PHYSICAL THERAPY TREATMENT    Patient Name: Louis Perkins MRN: [id number] DOB:1970-06-08, 51 y.o., male Today's Date: 02/08/2022   PT End of Session - 02/08/22 0955     Visit Number 3    Date for PT Re-Evaluation 03/15/22    Authorization Type [employer]    PT Start Time 0932    PT Stop Time 1010    PT Time Calculation (min) 38 min    Activity Tolerance Patient tolerated treatment well    Behavior During Therapy WFL for tasks assessed/performed               Past Medical History:  Diagnosis Date   History of chicken pox    Thyroid disease    Past Surgical History:  Procedure Laterality Date   ADENOIDECTOMY  1985   BRAIN SURGERY     KNEE ARTHROSCOPY W/ ACL RECONSTRUCTION Right 2006   Cadaver graft   Patient Active Problem List   Diagnosis Date Noted   Cervicalgia 01/18/2022   Hypertension 10/27/2021   OSA (obstructive sleep apnea) 04/21/2021   IGT (impaired glucose tolerance) 03/02/2021   Hyperlipidemia 03/02/2021   Lumbar foraminal stenosis 08/24/2020   Loud snoring 08/16/2020   Vitamin D deficiency 02/20/2020   Hypothyroidism 02/19/2020   DDD (degenerative disc disease), lumbar 02/19/2020   Insomnia 02/19/2020   Primary osteoarthritis of right knee 01/14/2018   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[place]) 08/21/2016   History of benign brain tumor 07/04/2013   Hearing loss in right ear 09/05/2011   Rotator cuff impingement syndrome 07/19/2011   Achilles tendonitis 07/19/2011   Family history of early CAD 07/19/2011   Elevated BP without diagnosis of hypertension 07/19/2011    PCP: Isaac Bliss, Rayford Halsted, MD   REFERRING PROVIDER: Newman Pies, MD  REFERRING DIAG: M54.2 (ICD-10-CM) - Cervicalgia  THERAPY DIAG:  Cervicalgia  Muscle weakness (generalized)  Cramp and spasm  Rationale for Evaluation and Treatment: Rehabilitation  ONSET DATE: 12/26/2021  SUBJECTIVE:                                                                                                                                                                                                          SUBJECTIVE STATEMENT: My arms are feeling better overall.  I don't think I want to do dry needling  PERTINENT HISTORY:  na  PAIN:  Are you having pain? Yes: NPRS scale: 4/10 Pain location: bilateral UE's Pain description: tingling Aggravating factors: overhead lifting, carrying backpack Relieving factors: rest  PRECAUTIONS: None  WEIGHT BEARING RESTRICTIONS: No  FALLS:  Has patient fallen in last 6 months? No  LIVING ENVIRONMENT: Lives with: lives with their family Lives in: House/apartment   OCCUPATION: Sports coach  PLOF: Independent, Independent with basic ADLs, Independent with household mobility without device, Independent with community mobility without device, Independent with homemaking with ambulation, Independent with gait, and Independent with transfers  PATIENT GOALS: He hopes to eliminate radicular symptoms and avoid surgery or injections and be able to continue his workouts and have pain free functional use of both shoulders.   OBJECTIVE:   DIAGNOSTIC FINDINGS:  IMPRESSION: 1. At C5-6 there is a broad-based disc bulge with a shallow broad right paracentral disc protrusion. Moderate right foraminal stenosis. No left foraminal stenosis. Mild spinal stenosis. 2. At C6-7 there is mild bilateral foraminal narrowing. 3. At C7-T1 there is a tiny right paracentral disc protrusion. Mild bilateral foraminal stenosis.  PATIENT SURVEYS:  FOTO complete next visit  COGNITION: Overall cognitive status: Within functional limits for tasks assessed  SENSATION: Intermittant radicular symptoms  POSTURE: rounded shoulders  PALPATION: Trigger points and tight bands bilateral upper traps and parascapular areas.     CERVICAL ROM:   Active ROM A/PROM (deg) eval  Flexion WFL  Extension WFL  Right lateral flexion 25   Left lateral flexion 30  Right rotation 45  Left rotation 45   (Blank rows = not tested)  UPPER EXTREMITY ROM:  WFL  (Blank rows = not tested)  UPPER EXTREMITY MMT:  All generally 4+ to 5/5 TODAY'S TREATMENT:                                                                                                                     DATE: 02/08/22 Arm bike: Level 1.7 x 6 minutes (3/3)- PT present to discuss progress Prone over blue ball: W, T, flexion, Y x 10 each 2# added  Cervical rotation and sidebending 3x20 seconds  Sidelying ER with 4# weights  Open book: x 10 each Cervical mechanical traction: 22#/8# 60 seconds/10 seconds x15 minutes  TODAY'S TREATMENT:                                                                                                                     DATE: 01/18/22 Arm bike: Level 1.7 x 6 minutes (3/3)- PT present to discuss progress Prone over blue ball: W, T, flexion, Y x 10 each Cervical rotation and sidebending 3x20 seconds  Levator stretch 3x20 seconds  Open book: x 10 each Cervical mechanical traction: 22#/8# 60 seconds/10 seconds  x15 minutes   TODAY'S TREATMENT:                                                                                                          DATE: 01/18/22 Initial eval completed, initiated HEP,   traction 20 lbs, 2 steps, 5 sec hold between steps, 100% speed, 60 sec hold at max   PATIENT EDUCATION:  Education details: Initiated HEP,  educate on DN and provided handout Person educated: Patient Education method: Explanation, Demonstration, Verbal cues, and Handouts Education comprehension: verbalized understanding, returned demonstration, and verbal cues required  HOME EXERCISE PROGRAM: Access Code: [email or web address] URL: [email or web address] Date: 01/18/2022 Prepared by: Candyce Churn  Exercises - Prone Shoulder W on [employer]  - 1 x daily - 7 x weekly - 1 sets - 10 reps - Prone Shoulder Flexion on Swiss Ball with  Dumbbell  - 1 x daily - 7 x weekly - 1 sets - 10 reps - Prone Shoulder Horizontal Abduction on Swiss Ball  - 1 x daily - 7 x weekly - 1 sets - 10 reps - Prone Shoulder Horizontal Abduction on Swiss Ball  - 1 x daily - 7 x weekly - 1 sets - 10 reps - Prone Lower Trapezius Strengthening on Swiss Ball  - 1 x daily - 7 x weekly - 1 sets - 10 reps  ASSESSMENT:  CLINICAL IMPRESSION: Pt was sick last week and not able to attend PT.  Pt reports compliance with HEP and has added weights to prone exercises on ball.  Pt with reduced pain today vs. Last session and reports a max of 4/10 pain over the past few days, meeting STG. Pt is responding well to traction.   Patient will benefit from skilled PT to address the below impairments and improve overall function.   OBJECTIVE IMPAIRMENTS: decreased ROM, decreased strength, increased fascial restrictions, increased muscle spasms, and impaired flexibility.   ACTIVITY LIMITATIONS: carrying, lifting, reach over head, and hygiene/grooming  PARTICIPATION LIMITATIONS: occupation, yard work, and workouts  PERSONAL FACTORS: Past/current experiences and Time since onset of injury/illness/exacerbation are also affecting patient's functional outcome.   REHAB POTENTIAL: Good  CLINICAL DECISION MAKING: Stable/uncomplicated  EVALUATION COMPLEXITY: Low   GOALS: Goals reviewed with patient? Yes  SHORT TERM GOALS: Target date: 02/15/2022   Patient will be independent with initial HEP  Baseline:  Goal status: MET  2.  Pain report to be no greater than 4/10 with activity Baseline: 4/10 max (02/08/22) Goal status: INITIAL   LONG TERM GOALS: Target date: 03/15/2022  Patient to be independent with advanced HEP  Baseline: na Goal status: INITIAL  2.  Patient to report pain no greater than 2/10  Baseline: na Goal status: INITIAL  3.  Patient to be able to reach overhead without onset of radicular symptoms Baseline: na Goal status: INITIAL  4.  Patient to  be able to do his usual lifting workout without increased arm symptoms Baseline: na Goal status: INITIAL  5.  Patient to be able to avoid injections or surgery Baseline: na Goal status: INITIAL  PLAN:  PT FREQUENCY: 2x/week  PT DURATION: 8 weeks  PLANNED INTERVENTIONS: Therapeutic exercises, Therapeutic activity, Neuromuscular re-education, Balance training, Gait training, Patient/Family education, Self Care, Joint mobilization, Aquatic Therapy, Dry Needling, Electrical stimulation, Spinal mobilization, Cryotherapy, Moist heat, Taping, Vasopneumatic device, Traction, Ultrasound, Ionotophoresis 86m/ml Dexamethasone, Manual therapy, and Re-evaluation  PLAN FOR NEXT SESSION: postural strength, continue traction, postural and shoulder strength, thoracic and cervical flexibility.   KSigurd Sos PT 02/08/22 9:56 AM  [address] [place] [address]Phone # [phone] [phone]

## 2022-02-09 ENCOUNTER — Ambulatory Visit: Payer: 59 | Admitting: Physical Therapy

## 2022-02-09 ENCOUNTER — Telehealth: Payer: Self-pay | Admitting: Physical Therapy

## 2022-02-09 NOTE — Telephone Encounter (Signed)
Left voicemail regarding missed appt this morning.  Reminded of next appt on 12/5

## 2022-02-12 ENCOUNTER — Other Ambulatory Visit: Payer: Self-pay | Admitting: Internal Medicine

## 2022-02-12 DIAGNOSIS — E039 Hypothyroidism, unspecified: Secondary | ICD-10-CM

## 2022-02-12 DIAGNOSIS — E782 Mixed hyperlipidemia: Secondary | ICD-10-CM

## 2022-02-14 ENCOUNTER — Ambulatory Visit: Payer: 59 | Admitting: Physical Therapy

## 2022-02-14 NOTE — Therapy (Incomplete)
OUTPATIENT PHYSICAL THERAPY TREATMENT    Patient Name: Louis Perkins MRN: [id number] DOB:1971/01/24, 51 y.o., male Today's Date: 02/14/2022       Past Medical History:  Diagnosis Date   History of chicken pox    Thyroid disease    Past Surgical History:  Procedure Laterality Date   ADENOIDECTOMY  1985   BRAIN SURGERY     KNEE ARTHROSCOPY W/ ACL RECONSTRUCTION Right 2006   Cadaver graft   Patient Active Problem List   Diagnosis Date Noted   Cervicalgia 01/18/2022   Hypertension 10/27/2021   OSA (obstructive sleep apnea) 04/21/2021   IGT (impaired glucose tolerance) 03/02/2021   Hyperlipidemia 03/02/2021   Lumbar foraminal stenosis 08/24/2020   Loud snoring 08/16/2020   Vitamin D deficiency 02/20/2020   Hypothyroidism 02/19/2020   DDD (degenerative disc disease), lumbar 02/19/2020   Insomnia 02/19/2020   Primary osteoarthritis of right knee 01/14/2018   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  Rationale for Evaluation and Treatment: Rehabilitation  ONSET DATE: 12/26/2021  SUBJECTIVE:                                                                                                                                                                                                         SUBJECTIVE STATEMENT: My arms are feeling better overall.  I don't think I want to do dry needling  PERTINENT HISTORY:  na  PAIN:  Are you having pain? Yes: NPRS scale: 4/10 Pain location: bilateral UE's Pain description:  tingling Aggravating factors: overhead lifting, carrying backpack Relieving factors: rest  PRECAUTIONS: None  WEIGHT BEARING RESTRICTIONS: No  FALLS:  Has patient fallen in last 6 months? No  LIVING ENVIRONMENT: Lives with: lives with their family Lives in: House/apartment   OCCUPATION: Sports coach  PLOF: Independent, Independent with basic ADLs, Independent with household mobility without device, Independent with community mobility without device, Independent with homemaking with ambulation, Independent with gait, and Independent with transfers  PATIENT GOALS: He hopes to eliminate radicular symptoms and avoid surgery or injections and be able to continue his workouts and have pain free functional use of both  shoulders.   OBJECTIVE:   DIAGNOSTIC FINDINGS:  IMPRESSION: 1. At C5-6 there is a broad-based disc bulge with a shallow broad right paracentral disc protrusion. Moderate right foraminal stenosis. No left foraminal stenosis. Mild spinal stenosis. 2. At C6-7 there is mild bilateral foraminal narrowing. 3. At C7-T1 there is a tiny right paracentral disc protrusion. Mild bilateral foraminal stenosis.  PATIENT SURVEYS:  FOTO complete next visit  COGNITION: Overall cognitive status: Within functional limits for tasks assessed  SENSATION: Intermittant radicular symptoms  POSTURE: rounded shoulders  PALPATION: Trigger points and tight bands bilateral upper traps and parascapular areas.     CERVICAL ROM:   Active ROM A/PROM (deg) eval  Flexion WFL  Extension WFL  Right lateral flexion 25  Left lateral flexion 30  Right rotation 45  Left rotation 45   (Blank rows = not tested)  UPPER EXTREMITY ROM:  WFL  (Blank rows = not tested)  UPPER EXTREMITY MMT:  All generally 4+ to 5/5 TODAY'S TREATMENT:                                                                                                                     DATE:  02/14/22:   02/08/22 Arm bike:  Level 1.7 x 6 minutes (3/3)- PT present to discuss progress Prone over blue ball: W, T, flexion, Y x 10 each 2# added  Cervical rotation and sidebending 3x20 seconds  Sidelying ER with 4# weights  Open book: x 10 each Cervical mechanical traction: 22#/8# 60 seconds/10 seconds x15 minutes  TODAY'S TREATMENT:                                                                                                                     DATE: 01/18/22 Arm bike: Level 1.7 x 6 minutes (3/3)- PT present to discuss progress Prone over blue ball: W, T, flexion, Y x 10 each Cervical rotation and sidebending 3x20 seconds  Levator stretch 3x20 seconds  Open book: x 10 each Cervical mechanical traction: 22#/8# 60 seconds/10 seconds x15 minutes   TODAY'S TREATMENT:  DATE: 01/18/22 Initial eval completed, initiated HEP,   traction 20 lbs, 2 steps, 5 sec hold between steps, 100% speed, 60 sec hold at max   PATIENT EDUCATION:  Education details: Initiated HEP,  educate on DN and provided handout Person educated: Patient Education method: Explanation, Demonstration, Verbal cues, and Handouts Education comprehension: verbalized understanding, returned demonstration, and verbal cues required  HOME EXERCISE PROGRAM: Access Code: [email or web address] URL: [email or web address] Date: 01/18/2022 Prepared by: Candyce Churn  Exercises - Prone Shoulder W on [employer]  - 1 x daily - 7 x weekly - 1 sets - 10 reps - Prone Shoulder Flexion on Swiss Ball with Dumbbell  - 1 x daily - 7 x weekly - 1 sets - 10 reps - Prone Shoulder Horizontal Abduction on Swiss Ball  - 1 x daily - 7 x weekly - 1 sets - 10 reps - Prone Shoulder Horizontal Abduction on Swiss Ball  - 1 x daily - 7 x weekly - 1 sets - 10 reps - Prone Lower Trapezius Strengthening on Swiss Ball  - 1 x daily - 7 x weekly - 1 sets - 10  reps  ASSESSMENT:  CLINICAL IMPRESSION: Pt was sick last week and not able to attend PT.  Pt reports compliance with HEP and has added weights to prone exercises on ball.  Pt with reduced pain today vs. Last session and reports a max of 4/10 pain over the past few days, meeting STG. Pt is responding well to traction.   Patient will benefit from skilled PT to address the below impairments and improve overall function.   OBJECTIVE IMPAIRMENTS: decreased ROM, decreased strength, increased fascial restrictions, increased muscle spasms, and impaired flexibility.   ACTIVITY LIMITATIONS: carrying, lifting, reach over head, and hygiene/grooming  PARTICIPATION LIMITATIONS: occupation, yard work, and workouts  PERSONAL FACTORS: Past/current experiences and Time since onset of injury/illness/exacerbation are also affecting patient's functional outcome.   REHAB POTENTIAL: Good  CLINICAL DECISION MAKING: Stable/uncomplicated  EVALUATION COMPLEXITY: Low   GOALS: Goals reviewed with patient? Yes  SHORT TERM GOALS: Target date: 02/15/2022   Patient will be independent with initial HEP  Baseline:  Goal status: MET  2.  Pain report to be no greater than 4/10 with activity Baseline: 4/10 max (02/08/22) Goal status: INITIAL   LONG TERM GOALS: Target date: 03/15/2022  Patient to be independent with advanced HEP  Baseline: na Goal status: INITIAL  2.  Patient to report pain no greater than 2/10  Baseline: na Goal status: INITIAL  3.  Patient to be able to reach overhead without onset of radicular symptoms Baseline: na Goal status: INITIAL  4.  Patient to be able to do his usual lifting workout without increased arm symptoms Baseline: na Goal status: INITIAL  5.  Patient to be able to avoid injections or surgery Baseline: na Goal status: INITIAL     PLAN:  PT FREQUENCY: 2x/week  PT DURATION: 8 weeks  PLANNED INTERVENTIONS: Therapeutic exercises, Therapeutic activity,  Neuromuscular re-education, Balance training, Gait training, Patient/Family education, Self Care, Joint mobilization, Aquatic Therapy, Dry Needling, Electrical stimulation, Spinal mobilization, Cryotherapy, Moist heat, Taping, Vasopneumatic device, Traction, Ultrasound, Ionotophoresis 27m/ml Dexamethasone, Manual therapy, and Re-evaluation  PLAN FOR NEXT SESSION: postural strength, continue traction, postural and shoulder strength, thoracic and cervical flexibility.   KSigurd Sos PT 02/14/22 7:55 AM  [facility] [place] [address]Phone # [phone] [phone]

## 2022-02-16 ENCOUNTER — Ambulatory Visit: Payer: 59 | Attending: Neurosurgery | Admitting: Physical Therapy

## 2022-02-16 DIAGNOSIS — M5416 Radiculopathy, lumbar region: Secondary | ICD-10-CM | POA: Diagnosis present

## 2022-02-16 DIAGNOSIS — M62838 Other muscle spasm: Secondary | ICD-10-CM | POA: Insufficient documentation

## 2022-02-16 DIAGNOSIS — M6281 Muscle weakness (generalized): Secondary | ICD-10-CM | POA: Diagnosis present

## 2022-02-16 DIAGNOSIS — M542 Cervicalgia: Secondary | ICD-10-CM | POA: Insufficient documentation

## 2022-02-16 DIAGNOSIS — R252 Cramp and spasm: Secondary | ICD-10-CM | POA: Diagnosis present

## 2022-02-16 NOTE — Therapy (Signed)
OUTPATIENT PHYSICAL THERAPY TREATMENT    Patient Name: Louis Perkins MRN: [id number] DOB:[birth year], 51 y.o., male Today's Date: 02/08/2022   PT End of Session - 02/08/22 0955     Visit Number 3    Date for PT Re-Evaluation 03/15/22    Authorization Type [employer]    PT Start Time 0932    PT Stop Time 1010    PT Time Calculation (min) 38 min    Activity Tolerance Patient tolerated treatment well    Behavior During Therapy WFL for tasks assessed/performed               Past Medical History:  Diagnosis Date   History of chicken pox    Thyroid disease    Past Surgical History:  Procedure Laterality Date   ADENOIDECTOMY  1985   BRAIN SURGERY     KNEE ARTHROSCOPY W/ ACL RECONSTRUCTION Right 2006   Cadaver graft   Patient Active Problem List   Diagnosis Date Noted   Cervicalgia 01/18/2022   Hypertension 10/27/2021   OSA (obstructive sleep apnea) 04/21/2021   IGT (impaired glucose tolerance) 03/02/2021   Hyperlipidemia 03/02/2021   Lumbar foraminal stenosis 08/24/2020   Loud snoring 08/16/2020   Vitamin D deficiency 02/20/2020   Hypothyroidism 02/19/2020   DDD (degenerative disc disease), lumbar 02/19/2020   Insomnia 02/19/2020   Primary osteoarthritis of right knee 01/14/2018   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[place]) 08/21/2016   History of benign brain tumor 07/04/2013   Hearing loss in right ear 09/05/2011   Rotator cuff impingement syndrome 07/19/2011   Achilles tendonitis 07/19/2011   Family history of early CAD 07/19/2011   Elevated BP without diagnosis of hypertension 07/19/2011    PCP: Isaac Bliss, Rayford Halsted, MD   REFERRING PROVIDER: Newman Pies, MD  REFERRING DIAG: M54.2 (ICD-10-CM) - Cervicalgia  THERAPY DIAG:  Cervicalgia  Muscle weakness (generalized)  Cramp and spasm  Rationale for Evaluation and Treatment: Rehabilitation  ONSET DATE: 12/26/2021  SUBJECTIVE:                                                                                                                                                                                                          SUBJECTIVE STATEMENT: Tingling with carrying something on my shoulder.  Been doing [employer] T [employer] exs at the gym.  Left > right UE symptoms.     PERTINENT HISTORY:  Na Had a negative experience with DN for LBP so reluctant to try DN for neck  PAIN:  Are you having pain? Yes: NPRS  scale: 3/10 Pain location: bilateral UE's Pain description: tingling Aggravating factors: overhead lifting, carrying backpack Relieving factors: rest  PRECAUTIONS: None  WEIGHT BEARING RESTRICTIONS: No  FALLS:  Has patient fallen in last 6 months? No  LIVING ENVIRONMENT: Lives with: lives with their family Lives in: House/apartment   OCCUPATION: Sports coach  PLOF: Independent, Independent with basic ADLs, Independent with household mobility without device, Independent with community mobility without device, Independent with homemaking with ambulation, Independent with gait, and Independent with transfers  PATIENT GOALS: He hopes to eliminate radicular symptoms and avoid surgery or injections and be able to continue his workouts and have pain free functional use of both shoulders.   OBJECTIVE:   DIAGNOSTIC FINDINGS:  IMPRESSION: 1. At C5-6 there is a broad-based disc bulge with a shallow broad right paracentral disc protrusion. Moderate right foraminal stenosis. No left foraminal stenosis. Mild spinal stenosis. 2. At C6-7 there is mild bilateral foraminal narrowing. 3. At C7-T1 there is a tiny right paracentral disc protrusion. Mild bilateral foraminal stenosis.  PATIENT SURVEYS:  FOTO complete next visit  COGNITION: Overall cognitive status: Within functional limits for tasks assessed  SENSATION: Intermittant radicular symptoms  POSTURE: rounded shoulders  PALPATION: Trigger points and tight bands bilateral upper traps and parascapular  areas.     CERVICAL ROM:   Active ROM A/PROM (deg) eval 12/7  Flexion WFL   Extension WFL   Right lateral flexion 25 46  Left lateral flexion 30 43  Right rotation 45 47  Left rotation 45 38   (Blank rows = not tested)  UPPER EXTREMITY ROM:  WFL  (Blank rows = not tested)  UPPER EXTREMITY MMT:  All generally 4+ to 5/5  TODAY'S TREATMENT:                                                                                                                     DATE: 02/16/22 Arm bike: Level 1.7 x 6 minutes (3/3)- PT present to discuss progress Extension produces tingling, left sidebending tingling Median nerve floss 10x right/left  Nerve bath Home traction options Cervical mechanical traction: 24#/12# 45 seconds/15 seconds x12 minutes      TODAY'S TREATMENT:                                                                                                                     DATE: 02/08/22 Arm bike: Level 1.7 x 6 minutes (3/3)- PT present to discuss progress Prone over blue ball: W, T, flexion, Y x 10 each  2# added  Cervical rotation and sidebending 3x20 seconds  Sidelying ER with 4# weights  Open book: x 10 each Cervical mechanical traction: 22#/8# 60 seconds/10 seconds x15 minutes  TODAY'S TREATMENT:                                                                                                                     DATE: 01/18/22 Arm bike: Level 1.7 x 6 minutes (3/3)- PT present to discuss progress Prone over blue ball: W, T, flexion, Y x 10 each Cervical rotation and sidebending 3x20 seconds  Levator stretch 3x20 seconds  Open book: x 10 each Cervical mechanical traction: 22#/8# 60 seconds/10 seconds x15 minutes   TODAY'S TREATMENT:                                                                                                          DATE: 01/18/22 Initial eval completed, initiated HEP,   traction 20 lbs, 2 steps, 5 sec hold between steps, 100% speed, 60 sec hold at max    PATIENT EDUCATION:  Education details: Initiated HEP,  educate on DN and provided handout Person educated: Patient Education method: Explanation, Demonstration, Verbal cues, and Handouts Education comprehension: verbalized understanding, returned demonstration, and verbal cues required  HOME EXERCISE PROGRAM: Access Code: [email or web address] URL: [email or web address] Date: 02/16/2022 Prepared by: Ruben Im  Exercises - Prone Shoulder W on Swiss Ball  - 1 x daily - 7 x weekly - 1 sets - 10 reps - Prone Shoulder Flexion on Swiss Ball with Dumbbell  - 1 x daily - 7 x weekly - 1 sets - 10 reps - Prone Shoulder Horizontal Abduction on Swiss Ball  - 1 x daily - 7 x weekly - 1 sets - 10 reps - Prone Shoulder Horizontal Abduction on Swiss Ball  - 1 x daily - 7 x weekly - 1 sets - 10 reps - Prone Lower Trapezius Strengthening on Swiss Ball  - 1 x daily - 7 x weekly - 1 sets - 10 reps - Median Nerve Flossing  - 1 x daily - 7 x weekly - 3 sets - 10 reps - Wrist Tendon Gliding  - 1 x daily - 7 x weekly - 3 sets - 10 reps - Seated Arm Bike  - 1 x daily - 7 x weekly - 3 sets - 10 reps  ASSESSMENT:  CLINICAL IMPRESSION: Bilateral UE tingling although left more prominent.  Produced/worsened with cervical extension and left sidebending.  Patient is highly compliant with prone WITY and stretching HEP.   Added neural mobility ex's  and instructed in "nerve bath " philosophy  with 1 min increments o of exercise.  Patient reports  a good response  to shortened hold time of traction.      Therapist monitoring response to all interventions and modifying treatment accordingly.      OBJECTIVE IMPAIRMENTS: decreased ROM, decreased strength, increased fascial restrictions, increased muscle spasms, and impaired flexibility.   ACTIVITY LIMITATIONS: carrying, lifting, reach over head, and hygiene/grooming  PARTICIPATION LIMITATIONS: occupation, yard work, and workouts  PERSONAL FACTORS: Past/current  experiences and Time since onset of injury/illness/exacerbation are also affecting patient's functional outcome.   REHAB POTENTIAL: Good  CLINICAL DECISION MAKING: Stable/uncomplicated  EVALUATION COMPLEXITY: Low   GOALS: Goals reviewed with patient? Yes  SHORT TERM GOALS: Target date: 02/15/2022   Patient will be independent with initial HEP  Baseline:  Goal status: MET  2.  Pain report to be no greater than 4/10 with activity Baseline: 4/10 max (02/08/22) Goal status: INITIAL   LONG TERM GOALS: Target date: 03/15/2022  Patient to be independent with advanced HEP  Baseline: na Goal status: INITIAL  2.  Patient to report pain no greater than 2/10  Baseline: na Goal status: INITIAL  3.  Patient to be able to reach overhead without onset of radicular symptoms Baseline: na Goal status: INITIAL  4.  Patient to be able to do his usual lifting workout without increased arm symptoms Baseline: na Goal status: INITIAL  5.  Patient to be able to avoid injections or surgery Baseline: na Goal status: INITIAL     PLAN:  PT FREQUENCY: 2x/week  PT DURATION: 8 weeks  PLANNED INTERVENTIONS: Therapeutic exercises, Therapeutic activity, Neuromuscular re-education, Balance training, Gait training, Patient/Family education, Self Care, Joint mobilization, Aquatic Therapy, Dry Needling, Electrical stimulation, Spinal mobilization, Cryotherapy, Moist heat, Taping, Vasopneumatic device, Traction, Ultrasound, Ionotophoresis 57m/ml Dexamethasone, Manual therapy, and Re-evaluation  PLAN FOR NEXT SESSION: cervical traction; nerve mobility; postural strength, postural and shoulder strength, thoracic and cervical flexibility.  SRuben Im PT 02/16/22 7:31 PM Phone: [phone]: [phone] [facility] [place] [address]Phone # [phone] [phone]

## 2022-02-20 ENCOUNTER — Ambulatory Visit: Payer: 59 | Admitting: Rehabilitative and Restorative Service Providers"

## 2022-02-20 ENCOUNTER — Encounter: Payer: Self-pay | Admitting: Rehabilitative and Restorative Service Providers"

## 2022-02-20 DIAGNOSIS — M542 Cervicalgia: Secondary | ICD-10-CM

## 2022-02-20 DIAGNOSIS — R252 Cramp and spasm: Secondary | ICD-10-CM

## 2022-02-20 DIAGNOSIS — M6281 Muscle weakness (generalized): Secondary | ICD-10-CM

## 2022-02-20 NOTE — Therapy (Signed)
OUTPATIENT PHYSICAL THERAPY TREATMENT    Patient Name: Louis Perkins MRN: [id number] DOB:[birth year], 51 y.o., male Today's Date: 02/20/2022   PT End of Session - 02/20/22 0933     Visit Number 5    Date for PT Re-Evaluation 03/15/22    Authorization Type [employer]    PT Start Time 0930    PT Stop Time 1025    PT Time Calculation (min) 55 min    Activity Tolerance Patient tolerated treatment well    Behavior During Therapy WFL for tasks assessed/performed               Past Medical History:  Diagnosis Date   History of chicken pox    Thyroid disease    Past Surgical History:  Procedure Laterality Date   ADENOIDECTOMY  1985   BRAIN SURGERY     KNEE ARTHROSCOPY W/ ACL RECONSTRUCTION Right 2006   Cadaver graft   Patient Active Problem List   Diagnosis Date Noted   Cervicalgia 01/18/2022   Hypertension 10/27/2021   OSA (obstructive sleep apnea) 04/21/2021   IGT (impaired glucose tolerance) 03/02/2021   Hyperlipidemia 03/02/2021   Lumbar foraminal stenosis 08/24/2020   Loud snoring 08/16/2020   Vitamin D deficiency 02/20/2020   Hypothyroidism 02/19/2020   DDD (degenerative disc disease), lumbar 02/19/2020   Insomnia 02/19/2020   Primary osteoarthritis of right knee 01/14/2018   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[place]) 08/21/2016   History of benign brain tumor 07/04/2013   Hearing loss in right ear 09/05/2011   Rotator cuff impingement syndrome 07/19/2011   Achilles tendonitis 07/19/2011   Family history of early CAD 07/19/2011   Elevated BP without diagnosis of hypertension 07/19/2011    PCP: Isaac Bliss, Rayford Halsted, MD   REFERRING PROVIDER: Newman Pies, MD  REFERRING DIAG: M54.2 (ICD-10-CM) - Cervicalgia  THERAPY DIAG:  Cervicalgia  Muscle weakness (generalized)  Cramp and spasm  Rationale for Evaluation and Treatment: Rehabilitation  ONSET DATE: 12/26/2021  SUBJECTIVE:                                                                                                                                                                                                          SUBJECTIVE STATEMENT: Pt states that he has been doing his exercises at the gym and tries to avoid overhead exercises.   PERTINENT HISTORY:  Na Had a negative experience with DN for LBP so reluctant to try DN for neck  PAIN:  Are you having pain? Yes: NPRS scale: 4/10 Pain location: bilateral UE's Pain description:  tingling Aggravating factors: overhead lifting, carrying backpack Relieving factors: rest  PRECAUTIONS: None  WEIGHT BEARING RESTRICTIONS: No  FALLS:  Has patient fallen in last 6 months? No  LIVING ENVIRONMENT: Lives with: lives with their family Lives in: House/apartment   OCCUPATION: Sports coach  PLOF: Independent, Independent with basic ADLs, Independent with household mobility without device, Independent with community mobility without device, Independent with homemaking with ambulation, Independent with gait, and Independent with transfers  PATIENT GOALS: He hopes to eliminate radicular symptoms and avoid surgery or injections and be able to continue his workouts and have pain free functional use of both shoulders.   OBJECTIVE:   DIAGNOSTIC FINDINGS:  IMPRESSION: 1. At C5-6 there is a broad-based disc bulge with a shallow broad right paracentral disc protrusion. Moderate right foraminal stenosis. No left foraminal stenosis. Mild spinal stenosis. 2. At C6-7 there is mild bilateral foraminal narrowing. 3. At C7-T1 there is a tiny right paracentral disc protrusion. Mild bilateral foraminal stenosis.  PATIENT SURVEYS:  FOTO complete next visit  COGNITION: Overall cognitive status: Within functional limits for tasks assessed  SENSATION: Intermittant radicular symptoms  POSTURE: rounded shoulders  PALPATION: Trigger points and tight bands bilateral upper traps and parascapular areas.      CERVICAL ROM:   Active ROM A/PROM (deg) eval 12/7  Flexion WFL   Extension WFL   Right lateral flexion 25 46  Left lateral flexion 30 43  Right rotation 45 47  Left rotation 45 38   (Blank rows = not tested)  UPPER EXTREMITY ROM:  WFL  (Blank rows = not tested)  UPPER EXTREMITY MMT:  All generally 4+ to 5/5  TODAY'S TREATMENT:                                                                                                                       DATE:  02/20/2022 UBE level 1.7 x3 min each direction with PT present to discuss status Sidelying open book x10 bilat Upper trap and levator stretch 2x20 sec bilat (with gentle tactile cuing/positioning for levator stretch) Standing shoulder ER and horizontal abduction with blue tband 2x10 bilat Trigger Point Dry-Needling  Treatment instructions: Expect mild to moderate muscle soreness. S/S of pneumothorax if dry needled over a lung field, and to seek immediate medical attention should they occur. Patient verbalized understanding of these instructions and education. Patient Consent Given: Yes Education handout provided: Yes Muscles treated: bilateral suboccipitals, left upper trap, left rhomboids, left cervical multifidi Electrical stimulation performed: No Parameters: N/A Treatment response/outcome: Utilized skilled palpation to locate trigger points.  Able to palpate twitch response and muscle elongation following. Manual Therapy:      DATE: 02/16/22 Arm bike: Level 1.7 x 6 minutes (3/3)- PT present to discuss progress Extension produces tingling, left sidebending tingling Median nerve floss 10x right/left  Nerve bath Home traction options Cervical mechanical traction: 24#/12# 45 seconds/15 seconds x12 minutes    DATE: 02/08/22 Arm bike: Level 1.7 x 6 minutes (3/3)- PT present to discuss  progress Prone over blue ball: W, T, flexion, Y x 10 each 2# added  Cervical rotation and sidebending 3x20 seconds  Sidelying ER with 4#  weights  Open book: x 10 each Cervical mechanical traction: 22#/8# 60 seconds/10 seconds x15 minutes    PATIENT EDUCATION:  Education details: Initiated HEP,  educate on DN and provided handout Person educated: Patient Education method: Explanation, Demonstration, Verbal cues, and Handouts Education comprehension: verbalized understanding, returned demonstration, and verbal cues required  HOME EXERCISE PROGRAM: Access Code: [email or web address] URL: [email or web address] Date: 02/16/2022 Prepared by: Ruben Im  Exercises - Prone Shoulder W on Swiss Ball  - 1 x daily - 7 x weekly - 1 sets - 10 reps - Prone Shoulder Flexion on Swiss Ball with Dumbbell  - 1 x daily - 7 x weekly - 1 sets - 10 reps - Prone Shoulder Horizontal Abduction on Swiss Ball  - 1 x daily - 7 x weekly - 1 sets - 10 reps - Prone Shoulder Horizontal Abduction on Swiss Ball  - 1 x daily - 7 x weekly - 1 sets - 10 reps - Prone Lower Trapezius Strengthening on Swiss Ball  - 1 x daily - 7 x weekly - 1 sets - 10 reps - Median Nerve Flossing  - 1 x daily - 7 x weekly - 3 sets - 10 reps - Wrist Tendon Gliding  - 1 x daily - 7 x weekly - 3 sets - 10 reps - Seated Arm Bike  - 1 x daily - 7 x weekly - 3 sets - 10 reps  ASSESSMENT:  CLINICAL IMPRESSION: Mr Borton presents to skilled PT reporting that he had to work over the weekend and is having some pain, but overall still has most relief with use of traction.  Patient states that she has not yet had a chance to check into traction units.  Offered to reach out to a DME provider for him, and he provided his agreement.  Notified Linton Flemings, Ph.D. with [employer] [phone]) and she states that she will reach out to patient and assist with possible payment through insurance.  Pt agreeable to trying dry needling today and following, he reported a decrease in pain to 2/10.  Then ended with traction, and pt further states pain deceased to 1/10.  Patient continues to  require skilled PT to progress towards goal related activities.    OBJECTIVE IMPAIRMENTS: decreased ROM, decreased strength, increased fascial restrictions, increased muscle spasms, and impaired flexibility.   ACTIVITY LIMITATIONS: carrying, lifting, reach over head, and hygiene/grooming  PARTICIPATION LIMITATIONS: occupation, yard work, and workouts  PERSONAL FACTORS: Past/current experiences and Time since onset of injury/illness/exacerbation are also affecting patient's functional outcome.   REHAB POTENTIAL: Good  CLINICAL DECISION MAKING: Stable/uncomplicated  EVALUATION COMPLEXITY: Low   GOALS: Goals reviewed with patient? Yes  SHORT TERM GOALS: Target date: 02/15/2022   Patient will be independent with initial HEP  Baseline:  Goal status: MET  2.  Pain report to be no greater than 4/10 with activity Baseline: 4/10 max (02/08/22) Goal status: MET   LONG TERM GOALS: Target date: 03/15/2022  Patient to be independent with advanced HEP  Baseline: na Goal status: INITIAL  2.  Patient to report pain no greater than 2/10  Baseline: na Goal status: INITIAL  3.  Patient to be able to reach overhead without onset of radicular symptoms Baseline: na Goal status: INITIAL  4.  Patient to be able to do his usual lifting workout without  increased arm symptoms Baseline: na Goal status: INITIAL  5.  Patient to be able to avoid injections or surgery Baseline: na Goal status: INITIAL     PLAN:  PT FREQUENCY: 2x/week  PT DURATION: 8 weeks  PLANNED INTERVENTIONS: Therapeutic exercises, Therapeutic activity, Neuromuscular re-education, Balance training, Gait training, Patient/Family education, Self Care, Joint mobilization, Aquatic Therapy, Dry Needling, Electrical stimulation, Spinal mobilization, Cryotherapy, Moist heat, Taping, Vasopneumatic device, Traction, Ultrasound, Ionotophoresis 42m/ml Dexamethasone, Manual therapy, and Re-evaluation  PLAN FOR NEXT SESSION:  Assess response to dry needling/manual, cervical traction; nerve mobility; postural strength, postural and shoulder strength, thoracic and cervical flexibility.    SJuel Burrow PT 02/20/22 11:01 AM  [facility]at[facility] [address]arts[place] [place] [address]Phone # [phone] [phone]

## 2022-02-20 NOTE — Patient Instructions (Signed)

## 2022-02-22 ENCOUNTER — Ambulatory Visit: Payer: 59

## 2022-02-22 DIAGNOSIS — M62838 Other muscle spasm: Secondary | ICD-10-CM

## 2022-02-22 DIAGNOSIS — M542 Cervicalgia: Secondary | ICD-10-CM | POA: Diagnosis not present

## 2022-02-22 DIAGNOSIS — M6281 Muscle weakness (generalized): Secondary | ICD-10-CM

## 2022-02-22 DIAGNOSIS — M5416 Radiculopathy, lumbar region: Secondary | ICD-10-CM

## 2022-02-22 DIAGNOSIS — R252 Cramp and spasm: Secondary | ICD-10-CM

## 2022-02-22 NOTE — Therapy (Signed)
OUTPATIENT PHYSICAL THERAPY TREATMENT    Patient Name: Louis Perkins MRN: [id number] DOB:Jul 11, 1970, 51 y.o., male Today's Date: 02/22/2022   PT End of Session - 02/22/22 0930     Visit Number 6    Date for PT Re-Evaluation 03/15/22    Authorization Type [employer]    PT Start Time 0932    PT Stop Time 1007    PT Time Calculation (min) 35 min    Activity Tolerance Patient tolerated treatment well    Behavior During Therapy WFL for tasks assessed/performed               Past Medical History:  Diagnosis Date   History of chicken pox    Thyroid disease    Past Surgical History:  Procedure Laterality Date   ADENOIDECTOMY  1985   BRAIN SURGERY     KNEE ARTHROSCOPY W/ ACL RECONSTRUCTION Right 2006   Cadaver graft   Patient Active Problem List   Diagnosis Date Noted   Cervicalgia 01/18/2022   Hypertension 10/27/2021   OSA (obstructive sleep apnea) 04/21/2021   IGT (impaired glucose tolerance) 03/02/2021   Hyperlipidemia 03/02/2021   Lumbar foraminal stenosis 08/24/2020   Loud snoring 08/16/2020   Vitamin D deficiency 02/20/2020   Hypothyroidism 02/19/2020   DDD (degenerative disc disease), lumbar 02/19/2020   Insomnia 02/19/2020   Primary osteoarthritis of right knee 01/14/2018   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[place]) 08/21/2016   History of benign brain tumor 07/04/2013   Hearing loss in right ear 09/05/2011   Rotator cuff impingement syndrome 07/19/2011   Achilles tendonitis 07/19/2011   Family history of early CAD 07/19/2011   Elevated BP without diagnosis of hypertension 07/19/2011    PCP: Isaac Bliss, Rayford Halsted, MD   REFERRING PROVIDER: Newman Pies, MD  REFERRING DIAG: M54.2 (ICD-10-CM) - Cervicalgia  THERAPY DIAG:  Cervicalgia  Muscle weakness (generalized)  Cramp and spasm  Radiculopathy, lumbar region  Other muscle spasm  Rationale for Evaluation and Treatment: Rehabilitation  ONSET DATE:  12/26/2021  SUBJECTIVE:                                                                                                                                                                                                         SUBJECTIVE STATEMENT: Pt states that his neck does feel a little looser since the dry needling.  He reports some soreness as expected but definitely looser.  Doing 2.5 lbs on his WITTY's.  He feels a "catch" in his neck this morning.     PERTINENT HISTORY:  Na Had a negative experience with DN for LBP so reluctant to try DN for neck  PAIN:  Are you having pain? Yes: NPRS scale: 4/10 Pain location: bilateral UE's Pain description: tingling Aggravating factors: overhead lifting, carrying backpack Relieving factors: rest  PRECAUTIONS: None  WEIGHT BEARING RESTRICTIONS: No  FALLS:  Has patient fallen in last 6 months? No  LIVING ENVIRONMENT: Lives with: lives with their family Lives in: House/apartment   OCCUPATION: Sports coach  PLOF: Independent, Independent with basic ADLs, Independent with household mobility without device, Independent with community mobility without device, Independent with homemaking with ambulation, Independent with gait, and Independent with transfers  PATIENT GOALS: He hopes to eliminate radicular symptoms and avoid surgery or injections and be able to continue his workouts and have pain free functional use of both shoulders.   OBJECTIVE:   DIAGNOSTIC FINDINGS:  IMPRESSION: 1. At C5-6 there is a broad-based disc bulge with a shallow broad right paracentral disc protrusion. Moderate right foraminal stenosis. No left foraminal stenosis. Mild spinal stenosis. 2. At C6-7 there is mild bilateral foraminal narrowing. 3. At C7-T1 there is a tiny right paracentral disc protrusion. Mild bilateral foraminal stenosis.  PATIENT SURVEYS:  FOTO complete next visit  COGNITION: Overall cognitive status: Within functional limits for  tasks assessed  SENSATION: Intermittant radicular symptoms  POSTURE: rounded shoulders  PALPATION: Trigger points and tight bands bilateral upper traps and parascapular areas.     CERVICAL ROM:   Active ROM A/PROM (deg) eval 12/7  Flexion WFL   Extension WFL   Right lateral flexion 25 46  Left lateral flexion 30 43  Right rotation 45 47  Left rotation 45 38   (Blank rows = not tested)  UPPER EXTREMITY ROM:  WFL  (Blank rows = not tested)  UPPER EXTREMITY MMT:  All generally 4+ to 5/5  TODAY'S TREATMENT:                                                                                                                      DATE:  02/22/2022 UBE level 1.0 x 2.5 min each direction with PT present to discuss status Prone over blue physio ball WITTY's with 3 lb x 10 reps Standing bilateral shoulder flexion with 3 lb x 20 Standing bilateral shoulder scaption with 3 lb x 20 4 D ball rolls with right shoulder on wall with red plyo ball x 20 each 90/90 plyo ball bounces on wall x 20 right shoulder for stability Cervical mechanical traction: 24#/12# 45 seconds/15 seconds x15 minutes   DATE:  02/20/2022 UBE level 1.7 x3 min each direction with PT present to discuss status Sidelying open book x10 bilat Upper trap and levator stretch 2x20 sec bilat (with gentle tactile cuing/positioning for levator stretch) Standing shoulder ER and horizontal abduction with blue tband 2x10 bilat Trigger Point Dry-Needling  Treatment instructions: Expect mild to moderate muscle soreness. S/S of pneumothorax if dry needled over a lung field, and to seek immediate medical attention should they occur.  Patient verbalized understanding of these instructions and education. Patient Consent Given: Yes Education handout provided: Yes Muscles treated: bilateral suboccipitals, left upper trap, left rhomboids, left cervical multifidi Electrical stimulation performed: No Parameters: N/A Treatment  response/outcome: Utilized skilled palpation to locate trigger points.  Able to palpate twitch response and muscle elongation following. Manual Therapy:      DATE: 02/16/22 Arm bike: Level 1.7 x 6 minutes (3/3)- PT present to discuss progress Extension produces tingling, left sidebending tingling Median nerve floss 10x right/left  Nerve bath Home traction options Cervical mechanical traction: 24#/12# 45 seconds/15 seconds x12 minutes      PATIENT EDUCATION:  Education details: Initiated HEP,  educate on DN and provided handout Person educated: Patient Education method: Explanation, Demonstration, Verbal cues, and Handouts Education comprehension: verbalized understanding, returned demonstration, and verbal cues required  HOME EXERCISE PROGRAM: Access Code: [email or web address] URL: [email or web address] Date: 02/16/2022 Prepared by: Ruben Im  Exercises - Prone Shoulder W on Swiss Ball  - 1 x daily - 7 x weekly - 1 sets - 10 reps - Prone Shoulder Flexion on Swiss Ball with Dumbbell  - 1 x daily - 7 x weekly - 1 sets - 10 reps - Prone Shoulder Horizontal Abduction on Swiss Ball  - 1 x daily - 7 x weekly - 1 sets - 10 reps - Prone Shoulder Horizontal Abduction on Swiss Ball  - 1 x daily - 7 x weekly - 1 sets - 10 reps - Prone Lower Trapezius Strengthening on Swiss Ball  - 1 x daily - 7 x weekly - 1 sets - 10 reps - Median Nerve Flossing  - 1 x daily - 7 x weekly - 3 sets - 10 reps - Wrist Tendon Gliding  - 1 x daily - 7 x weekly - 3 sets - 10 reps - Seated Arm Bike  - 1 x daily - 7 x weekly - 3 sets - 10 reps  ASSESSMENT:  CLINICAL IMPRESSION: Mr Golladay is progressing appropriately.  He had good response to dry needling and is progressing resistance on his postural and shoulder exercises.  He has made contact with the representative for the home traction unit and this is being processed.  He is compliant and well motivated.  He should continue to do well.   Patient  continues to require skilled PT to progress towards goal related activities.  OBJECTIVE IMPAIRMENTS: decreased ROM, decreased strength, increased fascial restrictions, increased muscle spasms, and impaired flexibility.   ACTIVITY LIMITATIONS: carrying, lifting, reach over head, and hygiene/grooming  PARTICIPATION LIMITATIONS: occupation, yard work, and workouts  PERSONAL FACTORS: Past/current experiences and Time since onset of injury/illness/exacerbation are also affecting patient's functional outcome.   REHAB POTENTIAL: Good  CLINICAL DECISION MAKING: Stable/uncomplicated  EVALUATION COMPLEXITY: Low   GOALS: Goals reviewed with patient? Yes  SHORT TERM GOALS: Target date: 02/15/2022   Patient will be independent with initial HEP  Baseline:  Goal status: MET  2.  Pain report to be no greater than 4/10 with activity Baseline: 4/10 max (02/08/22) Goal status: MET   LONG TERM GOALS: Target date: 03/15/2022  Patient to be independent with advanced HEP  Baseline: na Goal status: MET  2.  Patient to report pain no greater than 2/10  Baseline: na Goal status: INITIAL  3.  Patient to be able to reach overhead without onset of radicular symptoms Baseline: na Goal status: INITIAL  4.  Patient to be able to do his usual lifting workout without increased arm symptoms Baseline: na  Goal status: INITIAL  5.  Patient to be able to avoid injections or surgery Baseline: na Goal status: INITIAL     PLAN:  PT FREQUENCY: 2x/week  PT DURATION: 8 weeks  PLANNED INTERVENTIONS: Therapeutic exercises, Therapeutic activity, Neuromuscular re-education, Balance training, Gait training, Patient/Family education, Self Care, Joint mobilization, Aquatic Therapy, Dry Needling, Electrical stimulation, Spinal mobilization, Cryotherapy, Moist heat, Taping, Vasopneumatic device, Traction, Ultrasound, Ionotophoresis 41m/ml Dexamethasone, Manual therapy, and Re-evaluation  PLAN FOR NEXT  SESSION:  Dry needling/manual as indicated, cervical traction; nerve mobility; postural strength, postural and shoulder strength, thoracic and cervical flexibility.    JAnderson MaltaB. Ameri Cahoon, PT 02/22/22 10:10 AM   [facility] [address] [place] [id number] # [phone] [phone]

## 2022-02-27 ENCOUNTER — Encounter: Payer: Self-pay | Admitting: Rehabilitative and Restorative Service Providers"

## 2022-02-27 ENCOUNTER — Ambulatory Visit: Payer: 59 | Admitting: Rehabilitative and Restorative Service Providers"

## 2022-02-27 DIAGNOSIS — R252 Cramp and spasm: Secondary | ICD-10-CM

## 2022-02-27 DIAGNOSIS — M542 Cervicalgia: Secondary | ICD-10-CM | POA: Diagnosis not present

## 2022-02-27 DIAGNOSIS — M6281 Muscle weakness (generalized): Secondary | ICD-10-CM

## 2022-02-27 NOTE — Therapy (Addendum)
OUTPATIENT PHYSICAL THERAPY TREATMENT AND DISCHARGE SUMMARY   Patient Name: Louis Perkins MRN: [id number] DOB:1970-04-18, 51 y.o., male Today's Date: 02/27/2022   PT End of Session - 02/27/22 0932     Visit Number 7    Date for PT Re-Evaluation 03/15/22    Authorization Type [employer]    PT Start Time 0930    PT Stop Time 1009    PT Time Calculation (min) 39 min    Activity Tolerance Patient tolerated treatment well    Behavior During Therapy WFL for tasks assessed/performed               Past Medical History:  Diagnosis Date   History of chicken pox    Thyroid disease    Past Surgical History:  Procedure Laterality Date   ADENOIDECTOMY  1985   BRAIN SURGERY     KNEE ARTHROSCOPY W/ ACL RECONSTRUCTION Right 2006   Cadaver graft   Patient Active Problem List   Diagnosis Date Noted   Cervicalgia 01/18/2022   Hypertension 10/27/2021   OSA (obstructive sleep apnea) 04/21/2021   IGT (impaired glucose tolerance) 03/02/2021   Hyperlipidemia 03/02/2021   Lumbar foraminal stenosis 08/24/2020   Loud snoring 08/16/2020   Vitamin D deficiency 02/20/2020   Hypothyroidism 02/19/2020   DDD (degenerative disc disease), lumbar 02/19/2020   Insomnia 02/19/2020   Primary osteoarthritis of right knee 01/14/2018   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[place]) 08/21/2016   History of benign brain tumor 07/04/2013   Hearing loss in right ear 09/05/2011   Rotator cuff impingement syndrome 07/19/2011   Achilles tendonitis 07/19/2011   Family history of early CAD 07/19/2011   Elevated BP without diagnosis of hypertension 07/19/2011    PCP: Isaac Bliss, Rayford Halsted, MD   REFERRING PROVIDER: Newman Pies, MD  REFERRING DIAG: M54.2 (ICD-10-CM) - Cervicalgia  THERAPY DIAG:  Cervicalgia  Muscle weakness (generalized)  Cramp and spasm  Rationale for Evaluation and Treatment: Rehabilitation  ONSET DATE: 12/26/2021  SUBJECTIVE:                                                                                                                                                                                                          SUBJECTIVE STATEMENT: Pt with no new complaints.  States that his insurance would pay for cervical traction, but the rep we provided him was not considered in network.   PERTINENT HISTORY:  Na Had a negative experience with DN for LBP so reluctant to try DN for neck  PAIN:  Are you having pain?  Yes: NPRS scale: 3/10 Pain location: bilateral UE's Pain description: tingling Aggravating factors: overhead lifting, carrying backpack Relieving factors: rest  PRECAUTIONS: None  WEIGHT BEARING RESTRICTIONS: No  FALLS:  Has patient fallen in last 6 months? No  LIVING ENVIRONMENT: Lives with: lives with their family Lives in: House/apartment   OCCUPATION: Sports coach  PLOF: Independent, Independent with basic ADLs, Independent with household mobility without device, Independent with community mobility without device, Independent with homemaking with ambulation, Independent with gait, and Independent with transfers  PATIENT GOALS: He hopes to eliminate radicular symptoms and avoid surgery or injections and be able to continue his workouts and have pain free functional use of both shoulders.   OBJECTIVE:   DIAGNOSTIC FINDINGS:  IMPRESSION: 1. At C5-6 there is a broad-based disc bulge with a shallow broad right paracentral disc protrusion. Moderate right foraminal stenosis. No left foraminal stenosis. Mild spinal stenosis. 2. At C6-7 there is mild bilateral foraminal narrowing. 3. At C7-T1 there is a tiny right paracentral disc protrusion. Mild bilateral foraminal stenosis.  PATIENT SURVEYS:  FOTO complete next visit  COGNITION: Overall cognitive status: Within functional limits for tasks assessed  SENSATION: Intermittant radicular symptoms  POSTURE: rounded shoulders  PALPATION: Trigger points and  tight bands bilateral upper traps and parascapular areas.     CERVICAL ROM:   Active ROM A/PROM (deg) eval 12/7  Flexion WFL   Extension WFL   Right lateral flexion 25 46  Left lateral flexion 30 43  Right rotation 45 47  Left rotation 45 38   (Blank rows = not tested)  UPPER EXTREMITY ROM:  WFL  (Blank rows = not tested)  UPPER EXTREMITY MMT:  All generally 4+ to 5/5  TODAY'S TREATMENT:                                                                                                                       DATE:  02/27/2022 UBE level 1.4 x3 min each direction with PT present to discuss status Prone over blue physio ball WITTY's with 3 lb x 10 reps Standing bilateral shoulder flexion with 3 lb x 20 Standing bilateral shoulder scaption with 3 lb x 20 4 D ball rolls with right shoulder on wall with red plyo ball x 20 each 90/90 plyo ball bounces on wall x 20 bilat shoulder for stability 3 way scapular retraction with yellow loop 2x5 bilat Trigger Point Dry-Needling  Treatment instructions: Expect mild to moderate muscle soreness. S/S of pneumothorax if dry needled over a lung field, and to seek immediate medical attention should they occur. Patient verbalized understanding of these instructions and education. Patient Consent Given: Yes Education handout provided: Yes Muscles treated: bilateral suboccipitals, left upper trap, left rhomboids, left cervical multifidi Electrical stimulation performed: No Parameters: N/A Treatment response/outcome: Utilized skilled palpation to locate trigger points.  Able to palpate twitch response and muscle elongation following. Manual Therapy:  Soft tissue mobilization to thoracic and cervical region to further promote tissue elongation. Lat Pull 40#  x10, 70# x10 Tricep pressdowns 70# x10, 60# x10 Doorway stretch 2x20 sec   DATE:  02/22/2022 UBE level 1.0 x 2.5 min each direction with PT present to discuss status Prone over blue physio ball  WITTY's with 3 lb x 10 reps Standing bilateral shoulder flexion with 3 lb x 20 Standing bilateral shoulder scaption with 3 lb x 20 4 D ball rolls with right shoulder on wall with red plyo ball x 20 each 90/90 plyo ball bounces on wall x 20 right shoulder for stability Cervical mechanical traction: 24#/12# 45 seconds/15 seconds x15 minutes   DATE:  02/20/2022 UBE level 1.7 x3 min each direction with PT present to discuss status Sidelying open book x10 bilat Upper trap and levator stretch 2x20 sec bilat (with gentle tactile cuing/positioning for levator stretch) Standing shoulder ER and horizontal abduction with blue tband 2x10 bilat Trigger Point Dry-Needling  Treatment instructions: Expect mild to moderate muscle soreness. S/S of pneumothorax if dry needled over a lung field, and to seek immediate medical attention should they occur. Patient verbalized understanding of these instructions and education. Patient Consent Given: Yes Education handout provided: Yes Muscles treated: bilateral suboccipitals, left upper trap, left rhomboids, left cervical multifidi Electrical stimulation performed: No Parameters: N/A Treatment response/outcome: Utilized skilled palpation to locate trigger points.  Able to palpate twitch response and muscle elongation following.    PATIENT EDUCATION:  Education details: Initiated HEP,  educate on DN and provided handout Person educated: Patient Education method: Explanation, Demonstration, Verbal cues, and Handouts Education comprehension: verbalized understanding, returned demonstration, and verbal cues required  HOME EXERCISE PROGRAM: Access Code: [email or web address] URL: [email or web address] Date: 02/16/2022 Prepared by: Ruben Im  Exercises - Prone Shoulder W on Swiss Ball  - 1 x daily - 7 x weekly - 1 sets - 10 reps - Prone Shoulder Flexion on Swiss Ball with Dumbbell  - 1 x daily - 7 x weekly - 1 sets - 10 reps - Prone Shoulder Horizontal  Abduction on Swiss Ball  - 1 x daily - 7 x weekly - 1 sets - 10 reps - Prone Shoulder Horizontal Abduction on Swiss Ball  - 1 x daily - 7 x weekly - 1 sets - 10 reps - Prone Lower Trapezius Strengthening on Swiss Ball  - 1 x daily - 7 x weekly - 1 sets - 10 reps - Median Nerve Flossing  - 1 x daily - 7 x weekly - 3 sets - 10 reps - Wrist Tendon Gliding  - 1 x daily - 7 x weekly - 3 sets - 10 reps - Seated Arm Bike  - 1 x daily - 7 x weekly - 3 sets - 10 reps  ASSESSMENT:  CLINICAL IMPRESSION: Mr Rauner presents to skilled PT with continued reports of decreased pain overall.  Patient agreeable to dry needling again secondary to good response last week.  Patient continues to report decreased pain following dry needling.  Patient able to progress with strengthening and use of weight machines during session without any complaints of increased pain.  Patient continues to require skilled PT to progress towards goal related activities.  OBJECTIVE IMPAIRMENTS: decreased ROM, decreased strength, increased fascial restrictions, increased muscle spasms, and impaired flexibility.   ACTIVITY LIMITATIONS: carrying, lifting, reach over head, and hygiene/grooming  PARTICIPATION LIMITATIONS: occupation, yard work, and workouts  PERSONAL FACTORS: Past/current experiences and Time since onset of injury/illness/exacerbation are also affecting patient's functional outcome.   REHAB POTENTIAL: Good  CLINICAL DECISION MAKING: Stable/uncomplicated  EVALUATION COMPLEXITY:  Low   GOALS: Goals reviewed with patient? Yes  SHORT TERM GOALS: Target date: 02/15/2022   Patient will be independent with initial HEP  Baseline:  Goal status: MET  2.  Pain report to be no greater than 4/10 with activity Baseline: 4/10 max (02/08/22) Goal status: MET   LONG TERM GOALS: Target date: 03/15/2022  Patient to be independent with advanced HEP  Baseline: na Goal status: MET  2.  Patient to report pain no greater than  2/10  Baseline: na Goal status: IN PROGRESS  3.  Patient to be able to reach overhead without onset of radicular symptoms Baseline: na Goal status: INITIAL  4.  Patient to be able to do his usual lifting workout without increased arm symptoms Baseline: na Goal status: INITIAL  5.  Patient to be able to avoid injections or surgery Baseline: na Goal status: INITIAL     PLAN:  PT FREQUENCY: 2x/week  PT DURATION: 8 weeks  PLANNED INTERVENTIONS: Therapeutic exercises, Therapeutic activity, Neuromuscular re-education, Balance training, Gait training, Patient/Family education, Self Care, Joint mobilization, Aquatic Therapy, Dry Needling, Electrical stimulation, Spinal mobilization, Cryotherapy, Moist heat, Taping, Vasopneumatic device, Traction, Ultrasound, Ionotophoresis '4mg'$ /ml Dexamethasone, Manual therapy, and Re-evaluation  PLAN FOR NEXT SESSION:  Dry needling/manual as indicated, cervical traction; nerve mobility; postural strength, postural and shoulder strength, thoracic and cervical flexibility.    Juel Burrow, PT 02/27/22 10:14 AM   [facility] [address], [address] [place], [place] [address] Phone # [phone] Fax [phone]    PHYSICAL THERAPY DISCHARGE SUMMARY  As of 05/09/2022, pt has not returned since last visit.  Patient agrees to discharge. Patient goals were partially met. Patient is being discharged due to not returning since the last visit.  Gabrelle Roca, PT 05/09/22 10:07 AM

## 2022-03-01 ENCOUNTER — Ambulatory Visit: Payer: 59 | Admitting: Physical Therapy

## 2022-03-08 ENCOUNTER — Ambulatory Visit: Payer: 59

## 2022-03-10 ENCOUNTER — Ambulatory Visit: Payer: 59 | Admitting: Physical Therapy

## 2022-03-14 ENCOUNTER — Ambulatory Visit: Payer: 59 | Attending: Internal Medicine

## 2022-03-14 ENCOUNTER — Telehealth: Payer: Self-pay

## 2022-03-14 DIAGNOSIS — M542 Cervicalgia: Secondary | ICD-10-CM | POA: Insufficient documentation

## 2022-03-14 DIAGNOSIS — M62838 Other muscle spasm: Secondary | ICD-10-CM | POA: Insufficient documentation

## 2022-03-14 DIAGNOSIS — M5416 Radiculopathy, lumbar region: Secondary | ICD-10-CM | POA: Insufficient documentation

## 2022-03-14 DIAGNOSIS — M6281 Muscle weakness (generalized): Secondary | ICD-10-CM | POA: Insufficient documentation

## 2022-03-14 DIAGNOSIS — R252 Cramp and spasm: Secondary | ICD-10-CM | POA: Insufficient documentation

## 2022-03-14 NOTE — Telephone Encounter (Signed)
Call placed due to no show.  No answer.  Left message for patient to let him know he missed his appt this morning and that he did not have any further appts scheduled.  Requested he give [place] a call to let us know if he planned on continuing or if he wanted [place] to discontinue his therapy.  Phone number provided.

## 2022-04-25 ENCOUNTER — Other Ambulatory Visit: Payer: Self-pay | Admitting: Internal Medicine

## 2022-04-25 DIAGNOSIS — I10 Essential (primary) hypertension: Secondary | ICD-10-CM

## 2022-05-15 ENCOUNTER — Other Ambulatory Visit: Payer: Self-pay | Admitting: Otolaryngology

## 2022-05-15 DIAGNOSIS — Z86011 Personal history of benign neoplasm of the brain: Secondary | ICD-10-CM

## 2022-06-12 ENCOUNTER — Ambulatory Visit
Admission: RE | Admit: 2022-06-12 | Discharge: 2022-06-12 | Disposition: A | Discharge status: Home / Self Care | Payer: 59 | Source: Ambulatory Visit | Attending: Otolaryngology | Admitting: Otolaryngology

## 2022-06-12 DIAGNOSIS — Z86011 Personal history of benign neoplasm of the brain: Secondary | ICD-10-CM

## 2022-06-12 MED ORDER — GADOPICLENOL 0.5 MMOL/ML IV SOLN
10.0000 mL | Freq: Once | INTRAVENOUS | Status: AC | PRN
  Administered 2022-06-12: 10 mL via INTRAVENOUS

## 2022-07-17 ENCOUNTER — Other Ambulatory Visit: Payer: Self-pay | Admitting: Internal Medicine

## 2022-07-17 ENCOUNTER — Encounter: Payer: Self-pay | Admitting: Internal Medicine

## 2022-07-17 DIAGNOSIS — D333 Benign neoplasm of cranial nerves: Secondary | ICD-10-CM

## 2022-07-17 DIAGNOSIS — R519 Headache, unspecified: Secondary | ICD-10-CM

## 2022-07-26 ENCOUNTER — Ambulatory Visit
Admission: RE | Admit: 2022-07-26 | Discharge: 2022-07-26 | Disposition: A | Discharge status: Home / Self Care | Payer: 59 | Source: Ambulatory Visit | Attending: Internal Medicine | Admitting: Internal Medicine

## 2022-07-26 DIAGNOSIS — D333 Benign neoplasm of cranial nerves: Secondary | ICD-10-CM

## 2022-07-26 DIAGNOSIS — R519 Headache, unspecified: Secondary | ICD-10-CM

## 2022-07-26 DIAGNOSIS — G8929 Other chronic pain: Secondary | ICD-10-CM

## 2022-09-06 ENCOUNTER — Other Ambulatory Visit: Payer: Self-pay | Admitting: Internal Medicine

## 2022-09-06 DIAGNOSIS — E039 Hypothyroidism, unspecified: Secondary | ICD-10-CM

## 2022-09-29 ENCOUNTER — Other Ambulatory Visit: Payer: Self-pay | Admitting: Internal Medicine

## 2022-09-29 DIAGNOSIS — E782 Mixed hyperlipidemia: Secondary | ICD-10-CM

## 2022-12-08 ENCOUNTER — Other Ambulatory Visit: Payer: Self-pay | Admitting: Internal Medicine

## 2022-12-08 DIAGNOSIS — E039 Hypothyroidism, unspecified: Secondary | ICD-10-CM

## 2022-12-11 MED ORDER — LEVOTHYROXINE SODIUM 25 MCG PO TABS
25.0000 ug | ORAL_TABLET | Freq: Every day | ORAL | 0 refills | Status: DC
Start: 2022-12-11 — End: 2023-05-24

## 2023-03-12 ENCOUNTER — Other Ambulatory Visit: Payer: Self-pay | Admitting: Internal Medicine

## 2023-03-12 DIAGNOSIS — E039 Hypothyroidism, unspecified: Secondary | ICD-10-CM

## 2023-03-16 NOTE — Telephone Encounter (Signed)
 Lov: 10/27/2021  Last TSH 04/13/2021, within limit.   Attempted to reach pt for scheduling an appt, as he has not been seen over a year. Left a voicemail to call us back.

## 2023-03-28 ENCOUNTER — Telehealth: Payer: Self-pay | Admitting: Internal Medicine

## 2023-03-28 NOTE — Telephone Encounter (Signed)
 Pt needs to reschedule appointment for his physical--too many physicals were scheduled for that day.  Voicemail left on machine.

## 2023-04-12 ENCOUNTER — Encounter: Payer: 59 | Admitting: Internal Medicine

## 2023-04-18 ENCOUNTER — Encounter: Payer: 59 | Admitting: Internal Medicine

## 2023-05-24 ENCOUNTER — Ambulatory Visit (INDEPENDENT_AMBULATORY_CARE_PROVIDER_SITE_OTHER): Payer: 59 | Admitting: Internal Medicine

## 2023-05-24 ENCOUNTER — Encounter: Payer: Self-pay | Admitting: Internal Medicine

## 2023-05-24 VITALS — BP 126/87 | HR 67 | Temp 97.6°F | Ht 72.5 in | Wt 226.4 lb

## 2023-05-24 DIAGNOSIS — E039 Hypothyroidism, unspecified: Secondary | ICD-10-CM

## 2023-05-24 DIAGNOSIS — I10 Essential (primary) hypertension: Secondary | ICD-10-CM | POA: Diagnosis not present

## 2023-05-24 DIAGNOSIS — R7302 Impaired glucose tolerance (oral): Secondary | ICD-10-CM | POA: Diagnosis not present

## 2023-05-24 DIAGNOSIS — E782 Mixed hyperlipidemia: Secondary | ICD-10-CM

## 2023-05-24 DIAGNOSIS — G4700 Insomnia, unspecified: Secondary | ICD-10-CM

## 2023-05-24 DIAGNOSIS — G4733 Obstructive sleep apnea (adult) (pediatric): Secondary | ICD-10-CM

## 2023-05-24 LAB — LIPID PANEL
Cholesterol: 221 mg/dL — ABNORMAL HIGH (ref 0–200)
HDL: 63.4 mg/dL (ref >39.00)
LDL Cholesterol: 145 mg/dL — ABNORMAL HIGH (ref 0–99)
NonHDL: 157.78
Total CHOL/HDL Ratio: 3
Triglycerides: 65 mg/dL (ref 0.0–149.0)
VLDL: 13 mg/dL (ref 0.0–40.0)

## 2023-05-24 LAB — CBC WITH DIFFERENTIAL/PLATELET
Basophils Absolute: 0 10*3/uL (ref 0.0–0.1)
Basophils Relative: 0.9 % (ref 0.0–3.0)
Eosinophils Absolute: 0.2 10*3/uL (ref 0.0–0.7)
Eosinophils Relative: 3.8 % (ref 0.0–5.0)
HCT: 43.4 % (ref 39.0–52.0)
Hemoglobin: 14.7 g/dL (ref 13.0–17.0)
Lymphocytes Relative: 33.2 % (ref 12.0–46.0)
Lymphs Abs: 1.6 10*3/uL (ref 0.7–4.0)
MCHC: 33.8 g/dL (ref 30.0–36.0)
MCV: 90.5 fl (ref 78.0–100.0)
Monocytes Absolute: 0.4 10*3/uL (ref 0.1–1.0)
Monocytes Relative: 8.9 % (ref 3.0–12.0)
Neutro Abs: 2.5 10*3/uL (ref 1.4–7.7)
Neutrophils Relative %: 53.2 % (ref 43.0–77.0)
Platelets: 255 10*3/uL (ref 150.0–400.0)
RBC: 4.8 Mil/uL (ref 4.22–5.81)
RDW: 15.3 % (ref 11.5–15.5)
WBC: 4.7 10*3/uL (ref 4.0–10.5)

## 2023-05-24 LAB — COMPREHENSIVE METABOLIC PANEL
ALT: 12 U/L (ref 0–53)
AST: 18 U/L (ref 0–37)
Albumin: 4.4 g/dL (ref 3.5–5.2)
Alkaline Phosphatase: 51 U/L (ref 39–117)
BUN: 15 mg/dL (ref 6–23)
CO2: 29 meq/L (ref 19–32)
Calcium: 9.6 mg/dL (ref 8.4–10.5)
Chloride: 102 meq/L (ref 96–112)
Creatinine, Ser: 1.24 mg/dL (ref 0.40–1.50)
GFR: 66.75 mL/min (ref >60.00)
Glucose, Bld: 92 mg/dL (ref 70–99)
Potassium: 3.9 meq/L (ref 3.5–5.1)
Sodium: 137 meq/L (ref 135–145)
Total Bilirubin: 0.4 mg/dL (ref 0.2–1.2)
Total Protein: 7.5 g/dL (ref 6.0–8.3)

## 2023-05-24 LAB — TSH: TSH: 3.63 u[IU]/mL (ref 0.35–5.50)

## 2023-05-24 LAB — HEMOGLOBIN A1C: Hgb A1c MFr Bld: 5.5 % (ref 4.6–6.5)

## 2023-05-24 MED ORDER — AMLODIPINE BESYLATE 5 MG PO TABS: 5.0000 mg | ORAL_TABLET | Freq: Every day | ORAL | 1 refills | Status: DC

## 2023-05-24 NOTE — Progress Notes (Signed)
 Established Patient Office Visit     CC/Reason for Visit: Follow-up chronic conditions  HPI: Louis Perkins is a 53 y.o. male who is coming in today for the above mentioned reasons.  Has not been seen since 2023.  Has not been taking levothyroxine, amlodipine or atorvastatin.  Is still having issues with sleeping, has been diagnosed with OSA.  Blood pressure is elevated today.   Past Medical/Surgical History: Past Medical History:  Diagnosis Date   Depression 03/24   History of chicken pox    Hypertension    Sleep apnea 2023   Thyroid disease     Past Surgical History:  Procedure Laterality Date   ADENOIDECTOMY  1985   BRAIN SURGERY     KNEE ARTHROSCOPY W/ ACL RECONSTRUCTION Right 2006   Cadaver graft    Social History:  reports that he has never smoked. He has never used smokeless tobacco. He reports current alcohol use. He reports that he does not use drugs.  Allergies: No Known Allergies  Family History:  Family History  Problem Relation Age of Onset   Heart disease Father    Cancer Maternal Uncle        prostate CA   Arthritis Maternal Grandmother    Hypertension Maternal Grandmother    Cancer Cousin        breast CA   Cancer Maternal Aunt    Cancer Maternal Uncle    Diabetes Maternal Uncle    Colon cancer Neg Hx    Colon polyps Neg Hx    Esophageal cancer Neg Hx    Rectal cancer Neg Hx    Stomach cancer Neg Hx      Current Outpatient Medications:    Cyanocobalamin (VITAMIN B-12 PO), Take by mouth., Disp: , Rfl:    cyclobenzaprine (FLEXERIL) 5 MG tablet, TAKE 1 TABLET BY MOUTH AT BEDTIME AS NEEDED FOR MUSCLE SPASMS., Disp: 20 tablet, Rfl: 1   Diclofenac Sodium (PENNSAID) 2 % SOLN, Place 1 application onto the skin 2 (two) times daily., Disp: 1 Bottle, Rfl: 3   eszopiclone (LUNESTA) 1 MG TABS tablet, Take 1 tablet (1 mg total) by mouth at bedtime as needed for sleep. Take immediately before bedtime, Disp: 30 tablet, Rfl: 0   Multiple Vitamin  (MULTI-VITAMIN PO), Take by mouth., Disp: , Rfl:    Omega-3 Fatty Acids (FISH OIL PO), Take by mouth., Disp: , Rfl:    traZODone (DESYREL) 50 MG tablet, TAKE 1/2 -1 TABLETS BY MOUTH AT BEDTIME AS NEEDED FOR SLEEP., Disp: 90 tablet, Rfl: 1   amLODipine (NORVASC) 5 MG tablet, Take 1 tablet (5 mg total) by mouth daily., Disp: 90 tablet, Rfl: 1  Review of Systems:  Negative unless indicated in HPI.   Physical Exam: Vitals:   05/24/23 1325 05/24/23 1328  BP: (!) 150/100 126/87  Pulse: 67   Temp: 97.6 F (36.4 C)   TempSrc: Oral   SpO2: 100%   Weight: 226 lb 6.4 oz (102.7 kg)   Height: 6' 0.5" (1.842 m)     Body mass index is 30.28 kg/m.   Physical Exam Vitals reviewed.  Constitutional:      Appearance: Normal appearance.  HENT:     Head: Normocephalic and atraumatic.  Eyes:     Conjunctiva/sclera: Conjunctivae normal.     Pupils: Pupils are equal, round, and reactive to light.  Cardiovascular:     Rate and Rhythm: Normal rate and regular rhythm.  Pulmonary:     Effort: Pulmonary  effort is normal.     Breath sounds: Normal breath sounds.  Skin:    General: Skin is warm and dry.  Neurological:     General: No focal deficit present.     Mental Status: He is alert and oriented to person, place, and time.  Psychiatric:        Mood and Affect: Mood normal.        Behavior: Behavior normal.        Thought Content: Thought content normal.        Judgment: Judgment normal.     Flowsheet Row Office Visit from 05/24/2023 in [facility] [place]  PHQ-9 Total Score 6       Impression and Plan:  Mixed hyperlipidemia -     Lipid panel; Future  Primary hypertension -     CBC with Differential/Platelet; Future -     Comprehensive metabolic panel; Future -     amLODIPine Besylate; Take 1 tablet (5 mg total) by mouth daily.  Dispense: 90 tablet; Refill: 1  IGT (impaired glucose tolerance) -     Hemoglobin A1c; Future  Hypothyroidism, unspecified  type -     TSH; Future  Insomnia, unspecified type  OSA (obstructive sleep apnea) -     Ambulatory referral to Neurology   -Blood pressure is elevated.  Resume amlodipine 5 mg.  Return in 3 months for follow-up. -Check lipids and TSH today.  Resume medication if needed. -Check A1c. -Send referral to neurology for sleep study and CPAP.  Time spent:32 minutes reviewing chart, interviewing and examining patient and formulating plan of care.     Chaya Jan, MD [facility]

## 2023-05-28 ENCOUNTER — Encounter: Payer: Self-pay | Admitting: Internal Medicine

## 2023-05-28 ENCOUNTER — Other Ambulatory Visit: Payer: Self-pay | Admitting: Internal Medicine

## 2023-05-28 DIAGNOSIS — E782 Mixed hyperlipidemia: Secondary | ICD-10-CM

## 2023-05-28 MED ORDER — ROSUVASTATIN CALCIUM 5 MG PO TABS: 5.0000 mg | ORAL_TABLET | Freq: Every day | ORAL | 1 refills | Status: DC

## 2023-06-22 ENCOUNTER — Ambulatory Visit (INDEPENDENT_AMBULATORY_CARE_PROVIDER_SITE_OTHER): Admitting: Nurse Practitioner

## 2023-06-22 ENCOUNTER — Ambulatory Visit: Admitting: Nurse Practitioner

## 2023-06-22 ENCOUNTER — Encounter: Payer: Self-pay | Admitting: Nurse Practitioner

## 2023-06-22 VITALS — BP 136/76 | HR 89 | Ht 73.0 in | Wt 224.0 lb

## 2023-06-22 DIAGNOSIS — G4733 Obstructive sleep apnea (adult) (pediatric): Secondary | ICD-10-CM | POA: Diagnosis not present

## 2023-06-22 NOTE — Progress Notes (Signed)
 @Patient  ID: Louis Perkins, male    DOB: 27-Dec-1970, 53 y.o.   MRN: [id number]  Chief Complaint  Patient presents with   Consult    Had sleep study, had mask issues, went past 90 follow up period.  Never got call about mask fitting or new sleep study.    Referring provider: Zilphia Hilt, [place]*  HPI: 53 year old male, never smoker followed for OSA. He is a patient of Dr. Hortense Lyons and last seen in office 04/20/2021 by Sueanne Emerald, NP. Past medical history significant for HTN, insomnia, HLD, hypothyroid, cervicalgia.   TEST/EVENTS:  11/11/2020 HST: AHI 14.5, SpO2 low 86%  06/22/2023: Today - overdue follow up Discussed the use of AI scribe software for clinical note transcription with the patient, who gave verbal consent to proceed.  History of Present Illness   Louis Perkins is a 53 year old male with moderate sleep apnea who presents with sleep disturbances.  He experiences ongoing sleep disturbances, primarily characterized by difficulty staying asleep. He wakes up four to five times a night and does not feel rested. He also experiences snoring and occasionally wakes up gasping or feeling like he is choking. Morning headaches occur sometimes. No drowsy driving or falling asleep while driving. No history of sleepwalking or sleep paralysis.   He previously underwent a sleep study that showed moderate sleep apnea and was prescribed CPAP therapy. However, he did not complete the therapy due to issues with mask fit and insurance constraints. He initially tried a full mask and then a hybrid type, but both were uncomfortable and leaked. The challenges were compounded by the timing of the holidays, which led to the expiration of the 90-day adjustment period for the CPAP equipment so he returned it.   He has tried various sleep medications but continues to wake up during the night. He has not tried a nasal mask.  His weight has remained stable since his last sleep study. He consumes alcohol  occasionally, averaging four beers a week, and has never smoked. 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     [place] 11  No Known Allergies  Immunization History  Administered Date(s) Administered   Influenza,inj,Quad PF,6+ Mos 12/19/2019, 02/23/2021   PFIZER(Purple Top)SARS-COV-2 Vaccination 05/22/2019, 06/16/2019, 12/19/2019   Zoster Recombinant(Shingrix) 02/23/2021, 10/27/2021    Past Medical History:  Diagnosis Date   Depression 03/24   History of chicken pox    Hypertension    Sleep apnea 2023   Thyroid disease     Tobacco History: Social History   Tobacco Use  Smoking Status Never  Smokeless Tobacco Never   Counseling given: Not Answered   Outpatient Medications Prior to Visit  Medication Sig Dispense Refill   amLODipine (NORVASC) 5 MG tablet Take 1 tablet (5 mg total) by mouth daily. 90 tablet 1   Cyanocobalamin (VITAMIN B-12 PO) Take by mouth.     cyclobenzaprine (FLEXERIL) 5 MG tablet TAKE 1 TABLET BY MOUTH AT BEDTIME AS NEEDED FOR MUSCLE SPASMS. 20 tablet 1   Diclofenac Sodium (PENNSAID) 2 % SOLN Place 1 application onto the skin 2 (two) times daily. 1 Bottle 3   eszopiclone (LUNESTA) 1 MG TABS tablet Take 1 tablet (1 mg total) by mouth at bedtime as needed for sleep. Take immediately before bedtime 30 tablet 0   Multiple Vitamin (MULTI-VITAMIN PO) Take by mouth.     rosuvastatin (CRESTOR) 5 MG tablet Take 1 tablet (5 mg total) by mouth daily. 90 tablet 1  traZODone (DESYREL) 50 MG tablet TAKE 1/2 -1 TABLETS BY MOUTH AT BEDTIME AS NEEDED FOR SLEEP. 90 tablet 1   Omega-3 Fatty Acids (FISH OIL PO) Take by mouth.     No facility-administered medications prior to visit.     Review of Systems:   Constitutional: No weight loss or gain, night sweats, fevers, chills, or lassitude. +fatigue  HEENT: No headaches, difficulty swallowing, tooth/dental problems, or sore throat. No sneezing, itching, ear ache, nasal congestion, or post nasal drip CV:  No  chest pain, orthopnea, PND, swelling in lower extremities, anasarca, dizziness, palpitations, syncope Resp: +snoring. No shortness of breath with exertion or at rest. No excess mucus or change in color of mucus. No productive or non-productive. No hemoptysis. No wheezing.  No chest wall deformity GI:  No heartburn, indigestion GU: No nocturia  Skin: No rash, lesions, ulcerations MSK:  No joint pain or swelling.   Neuro: No dizziness or lightheadedness.  Psych: No depression or anxiety. Mood stable. +sleep disturbance    Physical Exam:  BP 136/76 (BP Location: Right Arm, Patient Position: Sitting, Cuff Size: Normal)   Pulse 89   Ht 6\' 1"  (1.854 m)   Wt 224 lb (101.6 kg)   SpO2 100%   BMI 29.55 kg/m   GEN: Pleasant, interactive, well-appearing; in no acute distress HEENT:  Normocephalic and atraumatic.  PERRLA. Sclera white. Nasal turbinates pink, moist and patent bilaterally. No rhinorrhea present. Oropharynx pink and moist, without exudate or edema. No lesions, ulcerations, or postnasal drip. Mallampati II/III NECK:  Supple w/ fair ROM. Thyroid symmetrical with no goiter or nodules palpated. No lymphadenopathy.   CV: RRR, no m/r/g, no peripheral edema. Pulses intact, +2 bilaterally. No cyanosis, pallor or clubbing. PULMONARY:  Unlabored, regular breathing. Clear bilaterally A&P w/o wheezes/rales/rhonchi. No accessory muscle use.  GI: BS present and normoactive. Soft, non-tender to palpation. No organomegaly or masses detected.  MSK: No erythema, warmth or tenderness. Cap refil <2 sec all extrem. Neuro: A/Ox3. No focal deficits noted.   Skin: Warm, no lesions or rashe Psych: Normal affect and behavior. Judgement and thought content appropriate.     Lab Results:  CBC    Component Value Date/Time   WBC 4.7 05/24/2023 1357   RBC 4.80 05/24/2023 1357   HGB 14.7 05/24/2023 1357   HCT 43.4 05/24/2023 1357   PLT 255.0 05/24/2023 1357   MCV 90.5 05/24/2023 1357   MCHC 33.8  05/24/2023 1357   RDW 15.3 05/24/2023 1357   LYMPHSABS 1.6 05/24/2023 1357   MONOABS 0.4 05/24/2023 1357   EOSABS 0.2 05/24/2023 1357   BASOSABS 0.0 05/24/2023 1357    BMET    Component Value Date/Time   NA 137 05/24/2023 1357   K 3.9 05/24/2023 1357   CL 102 05/24/2023 1357   CO2 29 05/24/2023 1357   GLUCOSE 92 05/24/2023 1357   BUN 15 05/24/2023 1357   CREATININE 1.24 05/24/2023 1357   CALCIUM 9.6 05/24/2023 1357    BNP No results found for: "BNP"   Imaging:  No results found.  Administration History     None           No data to display          No results found for: "NITRICOXIDE"      Assessment & Plan:   OSA (obstructive sleep apnea) He has a history of mildly moderate OSA. He has ongoing snoring, excessive daytime sleepiness, nocturnal apneic events, morning headaches, sleep disturbances. BMI 29. History of HTN.  Epworth 11. Given this,  I am concerned he still has sleep disordered breathing with obstructive sleep apnea. He will need a repeat sleep study to reassess severity of OSA and determine treatment options. He would be willing to resume CPAP if he still has sleep apnea. Would recommend alternative mask options and may need to set up for a mask desensitization or CPAP titration pending response.    - discussed how weight can impact sleep and risk for sleep disordered breathing - discussed options to assist with weight loss: combination of diet modification, cardiovascular and strength training exercises   - had an extensive discussion regarding the adverse health consequences related to untreated sleep disordered breathing - specifically discussed the risks for hypertension, coronary artery disease, cardiac dysrhythmias, cerebrovascular disease, and diabetes - lifestyle modification discussed   - discussed how sleep disruption can increase risk of accidents, particularly when driving - safe driving practices were discussed  Patient  Instructions  Given your symptoms and history, I am concerned that you still have sleep disordered breathing with sleep apnea. You will need a sleep study for further evaluation. Someone will contact you to schedule this.   We discussed how untreated sleep apnea puts an individual at risk for cardiac arrhthymias, pulm HTN, DM, stroke and increases their risk for daytime accidents. We also briefly reviewed treatment options including weight loss, side sleeping position, oral appliance, CPAP therapy or referral to ENT for possible surgical options  Use caution when driving and pull over if you become sleepy.  Follow up in 6 weeks with Katie Bandon Sherwin,NP to go over sleep study results, or sooner, if needed. Friday PM virtual clinic preferred        Advised if symptoms do not improve or worsen, to please contact office for sooner follow up or seek emergency care.   I spent 35 minutes of dedicated to the care of this patient on the date of this encounter to include pre-visit review of records, face-to-face time with the patient discussing conditions above, post visit ordering of testing, clinical documentation with the electronic health record, making appropriate referrals as documented, and communicating necessary findings to members of the patients care team.  Roetta Clarke, NP 06/24/2023  Pt aware and understands NP's role.

## 2023-06-22 NOTE — Patient Instructions (Signed)
 Given your symptoms and history, I am concerned that you still have sleep disordered breathing with sleep apnea. You will need a sleep study for further evaluation. Someone will contact you to schedule this.   We discussed how untreated sleep apnea puts an individual at risk for cardiac arrhthymias, pulm HTN, DM, stroke and increases their risk for daytime accidents. We also briefly reviewed treatment options including weight loss, side sleeping position, oral appliance, CPAP therapy or referral to ENT for possible surgical options  Use caution when driving and pull over if you become sleepy.  Follow up in 6 weeks with Katie Calistro Rauf,NP to go over sleep study results, or sooner, if needed. Friday PM virtual clinic preferred

## 2023-06-24 ENCOUNTER — Encounter: Payer: Self-pay | Admitting: Nurse Practitioner

## 2023-06-24 NOTE — Assessment & Plan Note (Signed)
 He has a history of mildly moderate OSA. He has ongoing snoring, excessive daytime sleepiness, nocturnal apneic events, morning headaches, sleep disturbances. BMI 29. History of HTN. Epworth 11. Given this,  I am concerned he still has sleep disordered breathing with obstructive sleep apnea. He will need a repeat sleep study to reassess severity of OSA and determine treatment options. He would be willing to resume CPAP if he still has sleep apnea. Would recommend alternative mask options and may need to set up for a mask desensitization or CPAP titration pending response.    - discussed how weight can impact sleep and risk for sleep disordered breathing - discussed options to assist with weight loss: combination of diet modification, cardiovascular and strength training exercises   - had an extensive discussion regarding the adverse health consequences related to untreated sleep disordered breathing - specifically discussed the risks for hypertension, coronary artery disease, cardiac dysrhythmias, cerebrovascular disease, and diabetes - lifestyle modification discussed   - discussed how sleep disruption can increase risk of accidents, particularly when driving - safe driving practices were discussed  Patient Instructions  Given your symptoms and history, I am concerned that you still have sleep disordered breathing with sleep apnea. You will need a sleep study for further evaluation. Someone will contact you to schedule this.   We discussed how untreated sleep apnea puts an individual at risk for cardiac arrhthymias, pulm HTN, DM, stroke and increases their risk for daytime accidents. We also briefly reviewed treatment options including weight loss, side sleeping position, oral appliance, CPAP therapy or referral to ENT for possible surgical options  Use caution when driving and pull over if you become sleepy.  Follow up in 6 weeks with Katie Nayab Aten,NP to go over sleep study results, or sooner, if  needed. Friday PM virtual clinic preferred

## 2023-07-04 ENCOUNTER — Encounter

## 2023-07-04 DIAGNOSIS — G4733 Obstructive sleep apnea (adult) (pediatric): Secondary | ICD-10-CM

## 2023-07-23 DIAGNOSIS — R069 Unspecified abnormalities of breathing: Secondary | ICD-10-CM | POA: Diagnosis not present

## 2023-08-03 ENCOUNTER — Encounter: Payer: Self-pay | Admitting: Nurse Practitioner

## 2023-08-03 ENCOUNTER — Ambulatory Visit: Admitting: Nurse Practitioner

## 2023-08-03 ENCOUNTER — Telehealth: Admitting: Nurse Practitioner

## 2023-08-03 DIAGNOSIS — G4733 Obstructive sleep apnea (adult) (pediatric): Secondary | ICD-10-CM

## 2023-08-03 NOTE — Progress Notes (Signed)
 Patient ID: Louis Perkins, male     DOB: 03-06-1971, 53 y.o.      MRN: [id number]  No chief complaint on file.   Virtual Visit via Video Note  I connected with Louis Perkins on 08/03/23 at  1:30 PM EDT by a video enabled telemedicine application and verified that I am speaking with the correct person using two identifiers.  Location: Patient: Home Provider: Office   I discussed the limitations of evaluation and management by telemedicine and the availability of in person appointments. The patient expressed understanding and agreed to proceed.  History of Present Illness: 53 year old male, never smoker followed for OSA. He is a patient of Dr. Hortense Lyons and last seen in office 04/20/2021 by Sueanne Emerald, NP. Past medical history significant for HTN, insomnia, HLD, hypothyroid, cervicalgia.    TEST/EVENTS:  11/11/2020 HST: AHI 14.5, SpO2 low 86%; 218 pounds 07/04/2023 HST: AHI 5.1, SpO2 low 77%; weight 225 pounds   06/22/2023: OV with Chasity Outten NP Discussed the use of AI scribe software for clinical note transcription with the patient, who gave verbal consent to proceed. Louis Perkins is a 53 year old male with moderate sleep apnea who presents with sleep disturbances. He experiences ongoing sleep disturbances, primarily characterized by difficulty staying asleep. He wakes up four to five times a night and does not feel rested. He also experiences snoring and occasionally wakes up gasping or feeling like he is choking. Morning headaches occur sometimes. No drowsy driving or falling asleep while driving. No history of sleepwalking or sleep paralysis.  He previously underwent a sleep study that showed moderate sleep apnea and was prescribed CPAP therapy. However, he did not complete the therapy due to issues with mask fit and insurance constraints. He initially tried a full mask and then a hybrid type, but both were uncomfortable and leaked. The challenges were compounded by the timing of the  holidays, which led to the expiration of the 90-day adjustment period for the CPAP equipment so he returned it.  He has tried various sleep medications but continues to wake up during the night. He has not tried a nasal mask. His weight has remained stable since his last sleep study. He consumes alcohol occasionally, averaging four beers a week, and has never smoked. 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  [place] [address]  08/03/2023: Today - follow up Patient presents today for follow-up via virtual visit to discuss repeat home sleep study results.  He had had a prior sleep study that revealed moderate sleep apnea in 2022.  He was briefly on CPAP but had difficulties with mask and there were some other life events going on so he ended up returning it.  He came back last month, April 2025, reestablish care and hopefully restart CPAP therapy.  His repeat home sleep study surprisingly only showed very mild sleep apnea but he did have significant oxygen desaturations.  He has a lot of issues with his sleep at night.  Difficulty staying asleep.  Wakes often.  Feels like he is choking and gasping sometimes when he wakes up.  Also has loud snoring.  Occasionally have some morning headaches.  Feels unchanged compared to his last visit. His weight is actually increased slightly compared to his last sleep study.  Wants to resume CPAP therapy.  Does not have any issues with drowsy driving.  No Known Allergies Immunization History  Administered Date(s) Administered   Influenza,inj,Quad PF,6+ Mos 12/19/2019, 02/23/2021   PFIZER(Purple Top)SARS-COV-2  Vaccination 05/22/2019, 06/16/2019, 12/19/2019   Zoster Recombinant(Shingrix ) 02/23/2021, 10/27/2021   Past Medical History:  Diagnosis Date   Depression 03/24   History of chicken pox    Hypertension    Sleep apnea 2023   Thyroid  disease     Tobacco History: Social History   Tobacco Use  Smoking Status Never  Smokeless Tobacco Never    Counseling given: Not Answered   Outpatient Medications Prior to Visit  Medication Sig Dispense Refill   amLODipine  (NORVASC ) 5 MG tablet Take 1 tablet (5 mg total) by mouth daily. 90 tablet 1   Cyanocobalamin (VITAMIN B-12 PO) Take by mouth.     cyclobenzaprine  (FLEXERIL ) 5 MG tablet TAKE 1 TABLET BY MOUTH AT BEDTIME AS NEEDED FOR MUSCLE SPASMS. 20 tablet 1   Diclofenac  Sodium (PENNSAID ) 2 % SOLN Place 1 application onto the skin 2 (two) times daily. 1 Bottle 3   eszopiclone  (LUNESTA ) 1 MG TABS tablet Take 1 tablet (1 mg total) by mouth at bedtime as needed for sleep. Take immediately before bedtime 30 tablet 0   Multiple Vitamin (MULTI-VITAMIN PO) Take by mouth.     rosuvastatin  (CRESTOR ) 5 MG tablet Take 1 tablet (5 mg total) by mouth daily. 90 tablet 1   traZODone  (DESYREL ) 50 MG tablet TAKE 1/2 -1 TABLETS BY MOUTH AT BEDTIME AS NEEDED FOR SLEEP. 90 tablet 1   No facility-administered medications prior to visit.     Review of Systems:   Constitutional: No weight loss or gain, night sweats, fevers, chills, or lassitude. +fatigue  HEENT: No headaches, difficulty swallowing, tooth/dental problems, or sore throat. No sneezing, itching, ear ache, nasal congestion, or post nasal drip CV:  No chest pain, orthopnea, PND, swelling in lower extremities, anasarca, dizziness, palpitations, syncope Resp: +snoring. No shortness of breath with exertion or at rest. No cough GI:  No heartburn, indigestion GU: No nocturia  Skin: No rash, lesions, ulcerations MSK:  No joint pain or swelling.   Neuro: No dizziness or lightheadedness.  Psych: No depression or anxiety. Mood stable. +sleep disturbance  Observations/Objective: Patient is well-developed, well-nourished in no acute distress.  Resting comfortably at home.  No labored breathing.  Speech is clear and coherent with logical content.  Patient is alert and oriented at baseline.   Assessment and Plan: OSA (obstructive sleep  apnea) History of moderate sleep apnea.  Repeat sleep study with very mild sleep apnea but significant oxygen desaturations.  Given history and current symptoms, shared decision to move forward with restarting CPAP therapy.  Orders placed today for auto CPAP 5-15 cmH2O and nasal mask of choice.  He had a lot of difficulties with fullface mask in the past.  Advised him that if he has any issues with mouth breathing or leaks while using nasal mask, may need to add on a chinstrap.  Will reassess at follow-up.  Reviewed proper care/use of device.  Aware of risks of untreated sleep apnea with overall minimal cardiovascular risks associated with mild sleep apnea.  Safe driving practices reviewed.  Healthy weight management encouraged.  Patient Instructions  Start CPAP every night, minimum of 4-6 hours a night.  Change equipment as directed. Wash your tubing with warm soap and water daily, hang to dry. Wash humidifier portion weekly. Use bottled, distilled water and change daily Be aware of reduced alertness and do not drive or operate heavy machinery if experiencing this or drowsiness.  Exercise encouraged, as tolerated. Healthy weight management discussed.  Avoid or decrease alcohol consumption and medications  that make you more sleepy, if possible. Notify if persistent daytime sleepiness occurs even with consistent use of PAP therapy.  Nasal mask of choice  Change supplies... Every month Mask cushions and/or nasal pillows CPAP machine filters Every 3 months Mask frame (not including the headgear) CPAP tubing Every 6 months Mask headgear Chin strap (if applicable) Humidifier water tub  Follow up in 10-12 weeks with Katie Geoffry Bannister,NP to review CPAP therapy, or sooner, if needed       I discussed the assessment and treatment plan with the patient. The patient was provided an opportunity to ask questions and all were answered. The patient agreed with the plan and demonstrated an understanding of  the instructions.   The patient was advised to call back or seek an in-person evaluation if the symptoms worsen or if the condition fails to improve as anticipated.  I provided 31 minutes of non-face-to-face time during this encounter.   Roetta Clarke, NP

## 2023-08-03 NOTE — Patient Instructions (Addendum)
 Start CPAP every night, minimum of 4-6 hours a night.  Change equipment as directed. Wash your tubing with warm soap and water daily, hang to dry. Wash humidifier portion weekly. Use bottled, distilled water and change daily Be aware of reduced alertness and do not drive or operate heavy machinery if experiencing this or drowsiness.  Exercise encouraged, as tolerated. Healthy weight management discussed.  Avoid or decrease alcohol consumption and medications that make you more sleepy, if possible. Notify if persistent daytime sleepiness occurs even with consistent use of PAP therapy.  Nasal mask of choice  Change supplies... Every month Mask cushions and/or nasal pillows CPAP machine filters Every 3 months Mask frame (not including the headgear) CPAP tubing Every 6 months Mask headgear Chin strap (if applicable) Humidifier water tub  Follow up in 10-12 weeks with Louis Bryana Froemming,NP to review CPAP therapy, or sooner, if needed

## 2023-08-03 NOTE — Assessment & Plan Note (Signed)
 History of moderate sleep apnea.  Repeat sleep study with very mild sleep apnea but significant oxygen desaturations.  Given history and current symptoms, shared decision to move forward with restarting CPAP therapy.  Orders placed today for auto CPAP 5-15 cmH2O and nasal mask of choice.  He had a lot of difficulties with fullface mask in the past.  Advised him that if he has any issues with mouth breathing or leaks while using nasal mask, may need to add on a chinstrap.  Will reassess at follow-up.  Reviewed proper care/use of device.  Aware of risks of untreated sleep apnea with overall minimal cardiovascular risks associated with mild sleep apnea.  Safe driving practices reviewed.  Healthy weight management encouraged.  Patient Instructions  Start CPAP every night, minimum of 4-6 hours a night.  Change equipment as directed. Wash your tubing with warm soap and water daily, hang to dry. Wash humidifier portion weekly. Use bottled, distilled water and change daily Be aware of reduced alertness and do not drive or operate heavy machinery if experiencing this or drowsiness.  Exercise encouraged, as tolerated. Healthy weight management discussed.  Avoid or decrease alcohol consumption and medications that make you more sleepy, if possible. Notify if persistent daytime sleepiness occurs even with consistent use of PAP therapy.  Nasal mask of choice  Change supplies... Every month Mask cushions and/or nasal pillows CPAP machine filters Every 3 months Mask frame (not including the headgear) CPAP tubing Every 6 months Mask headgear Chin strap (if applicable) Humidifier water tub  Follow up in 10-12 weeks with Katie Luan Maberry,NP to review CPAP therapy, or sooner, if needed

## 2023-08-13 ENCOUNTER — Encounter: Payer: Self-pay | Admitting: Internal Medicine

## 2023-08-13 ENCOUNTER — Ambulatory Visit (INDEPENDENT_AMBULATORY_CARE_PROVIDER_SITE_OTHER): Admitting: Internal Medicine

## 2023-08-13 VITALS — BP 130/80 | HR 76 | Temp 98.0°F | Wt 225.6 lb

## 2023-08-13 DIAGNOSIS — E782 Mixed hyperlipidemia: Secondary | ICD-10-CM | POA: Diagnosis not present

## 2023-08-13 DIAGNOSIS — I10 Essential (primary) hypertension: Secondary | ICD-10-CM

## 2023-08-13 DIAGNOSIS — R7302 Impaired glucose tolerance (oral): Secondary | ICD-10-CM

## 2023-08-13 DIAGNOSIS — G4700 Insomnia, unspecified: Secondary | ICD-10-CM

## 2023-08-13 LAB — LIPID PANEL
Cholesterol: 207 mg/dL — ABNORMAL HIGH (ref 0–200)
HDL: 78.5 mg/dL (ref >39.00)
LDL Cholesterol: 103 mg/dL — ABNORMAL HIGH (ref 0–99)
NonHDL: 128.41
Total CHOL/HDL Ratio: 3
Triglycerides: 126 mg/dL (ref 0.0–149.0)
VLDL: 25.2 mg/dL (ref 0.0–40.0)

## 2023-08-13 LAB — POCT GLYCOSYLATED HEMOGLOBIN (HGB A1C): Hemoglobin A1C: 5.6 % (ref 4.0–5.6)

## 2023-08-13 MED ORDER — ESZOPICLONE 1 MG PO TABS: 1.0000 mg | ORAL_TABLET | Freq: Every evening | ORAL | 0 refills | Status: AC | PRN

## 2023-08-13 MED ORDER — TRAZODONE HCL 50 MG PO TABS: 50.0000 mg | ORAL_TABLET | Freq: Every evening | ORAL | 1 refills | Status: AC | PRN

## 2023-08-13 NOTE — Progress Notes (Signed)
 Established Patient Office Visit     CC/Reason for Visit: Follow-up chronic conditions  HPI: Louis Perkins is a 53 y.o. male who is coming in today for the above mentioned reasons. Past Medical History is significant for: Hypertension, hyperlipidemia, hypothyroidism not on medications, impaired glucose tolerance.  Since I last saw him he has resumed taking amlodipine  and rosuvastatin .  He also has insomnia for which he takes Lunesta  and trazodone .   Past Medical/Surgical History: Past Medical History:  Diagnosis Date   Depression 03/24   History of chicken pox    Hypertension    Sleep apnea 2023   Thyroid  disease     Past Surgical History:  Procedure Laterality Date   ADENOIDECTOMY  1985   BRAIN SURGERY     KNEE ARTHROSCOPY W/ ACL RECONSTRUCTION Right 2006   Cadaver graft    Social History:  reports that he has never smoked. He has never used smokeless tobacco. He reports current alcohol use. He reports that he does not use drugs.  Allergies: No Known Allergies  Family History:  Family History  Problem Relation Age of Onset   Heart disease Father    Cancer Maternal Uncle        prostate CA   Arthritis Maternal Grandmother    Hypertension Maternal Grandmother    Cancer Cousin        breast CA   Cancer Maternal Aunt    Cancer Maternal Uncle    Diabetes Maternal Uncle    Colon cancer Neg Hx    Colon polyps Neg Hx    Esophageal cancer Neg Hx    Rectal cancer Neg Hx    Stomach cancer Neg Hx      Current Outpatient Medications:    amLODipine  (NORVASC ) 5 MG tablet, Take 1 tablet (5 mg total) by mouth daily., Disp: 90 tablet, Rfl: 1   Cyanocobalamin (VITAMIN B-12 PO), Take by mouth., Disp: , Rfl:    Diclofenac  Sodium (PENNSAID ) 2 % SOLN, Place 1 application onto the skin 2 (two) times daily., Disp: 1 Bottle, Rfl: 3   Multiple Vitamin (MULTI-VITAMIN PO), Take by mouth., Disp: , Rfl:    rosuvastatin  (CRESTOR ) 5 MG tablet, Take 1 tablet (5 mg total) by  mouth daily., Disp: 90 tablet, Rfl: 1  Review of Systems:  Negative unless indicated in HPI.   Physical Exam: Vitals:   08/13/23 1108  BP: 130/80  Pulse: 76  Temp: 98 F (36.7 C)  TempSrc: Oral  SpO2: 99%  Weight: 225 lb 9.6 oz (102.3 kg)    Body mass index is 29.76 kg/m.   Physical Exam Vitals reviewed.  Constitutional:      Appearance: Normal appearance.  HENT:     Head: Normocephalic and atraumatic.  Eyes:     Conjunctiva/sclera: Conjunctivae normal.  Cardiovascular:     Rate and Rhythm: Normal rate and regular rhythm.  Pulmonary:     Effort: Pulmonary effort is normal.     Breath sounds: Normal breath sounds.  Skin:    General: Skin is warm and dry.  Neurological:     General: No focal deficit present.     Mental Status: He is alert and oriented to person, place, and time.  Psychiatric:        Mood and Affect: Mood normal.        Behavior: Behavior normal.        Thought Content: Thought content normal.        Judgment: Judgment normal.  Flowsheet Row Office Visit from 08/13/2023 in [facility] [place]  PHQ-9 Total Score 7        Impression and Plan:  IGT (impaired glucose tolerance) -     POCT glycosylated hemoglobin (Hb A1C)  Mixed hyperlipidemia -     Lipid panel -     Comprehensive metabolic panel with GFR; Future -     Lipid panel; Future  Primary hypertension   - Blood pressure is well-controlled on amlodipine . - A1c of 5.6 demonstrates excellent glycemic control. - Check lipids today now on rosuvastatin  5 mg daily.  Time spent:30 minutes reviewing chart, interviewing and examining patient and formulating plan of care.     Marguerita Shih, MD [facility]

## 2023-08-15 ENCOUNTER — Encounter: Payer: Self-pay | Admitting: Internal Medicine

## 2023-08-15 ENCOUNTER — Ambulatory Visit: Payer: Self-pay | Admitting: Internal Medicine

## 2023-11-14 ENCOUNTER — Other Ambulatory Visit: Payer: Self-pay | Admitting: Internal Medicine

## 2023-11-14 DIAGNOSIS — I10 Essential (primary) hypertension: Secondary | ICD-10-CM

## 2023-11-18 ENCOUNTER — Other Ambulatory Visit: Payer: Self-pay | Admitting: Internal Medicine

## 2023-11-18 DIAGNOSIS — E782 Mixed hyperlipidemia: Secondary | ICD-10-CM

## 2023-11-27 ENCOUNTER — Encounter: Payer: Self-pay | Admitting: Internal Medicine

## 2023-11-27 ENCOUNTER — Ambulatory Visit: Admitting: Internal Medicine

## 2023-11-27 VITALS — BP 150/100 | HR 70 | Temp 97.9°F | Wt 231.4 lb

## 2023-11-27 DIAGNOSIS — E039 Hypothyroidism, unspecified: Secondary | ICD-10-CM | POA: Diagnosis not present

## 2023-11-27 DIAGNOSIS — R7302 Impaired glucose tolerance (oral): Secondary | ICD-10-CM

## 2023-11-27 DIAGNOSIS — M545 Low back pain, unspecified: Secondary | ICD-10-CM | POA: Diagnosis not present

## 2023-11-27 DIAGNOSIS — G8929 Other chronic pain: Secondary | ICD-10-CM

## 2023-11-27 DIAGNOSIS — R4589 Other symptoms and signs involving emotional state: Secondary | ICD-10-CM

## 2023-11-27 DIAGNOSIS — I10 Essential (primary) hypertension: Secondary | ICD-10-CM | POA: Diagnosis not present

## 2023-11-27 DIAGNOSIS — M25571 Pain in right ankle and joints of right foot: Secondary | ICD-10-CM

## 2023-11-27 LAB — TSH: TSH: 2.48 u[IU]/mL (ref 0.35–5.50)

## 2023-11-27 MED ORDER — MELOXICAM 7.5 MG PO TABS: 7.5000 mg | ORAL_TABLET | Freq: Every day | ORAL | 0 refills | Status: DC

## 2023-11-27 NOTE — Progress Notes (Signed)
 Established Patient Office Visit     CC/Reason for Visit: Discussed acute and chronic concerns  HPI: Louis Perkins is a 53 y.o. male who is coming in today for the above mentioned reasons. Past Medical History is significant for: Hypertension, hyperlipidemia, hypothyroidism, impaired glucose tolerance.  Is here today to discuss a few issues:  1.  Has been feeling a little depressed, no energy or well to do things that he normally enjoys doing.  2.  His back pain is again bothering him.  He has developed an antalgic gait and is now starting to have  right ankle pain over the dorsum of his foot.  No recent injuries or new footwear.  3.  Blood pressure is elevated.  He did miss a few days of medication while on vacation.   Past Medical/Surgical History: Past Medical History:  Diagnosis Date   Depression 03/24   History of chicken pox    Hypertension    Sleep apnea 2023   Thyroid  disease     Past Surgical History:  Procedure Laterality Date   ADENOIDECTOMY  1985   BRAIN SURGERY     KNEE ARTHROSCOPY W/ ACL RECONSTRUCTION Right 2006   Cadaver graft    Social History:  reports that he has never smoked. He has never used smokeless tobacco. He reports current alcohol use. He reports that he does not use drugs.  Allergies: No Known Allergies  Family History:  Family History  Problem Relation Age of Onset   Heart disease Father    Cancer Maternal Uncle        prostate CA   Arthritis Maternal Grandmother    Hypertension Maternal Grandmother    Cancer Cousin        breast CA   Cancer Maternal Aunt    Cancer Maternal Uncle    Diabetes Maternal Uncle    Colon cancer Neg Hx    Colon polyps Neg Hx    Esophageal cancer Neg Hx    Rectal cancer Neg Hx    Stomach cancer Neg Hx      Current Outpatient Medications:    amLODipine  (NORVASC ) 5 MG tablet, TAKE 1 TABLET (5 MG TOTAL) BY MOUTH DAILY., Disp: 30 tablet, Rfl: 5   Cyanocobalamin (VITAMIN B-12 PO), Take by  mouth., Disp: , Rfl:    Diclofenac  Sodium (PENNSAID ) 2 % SOLN, Place 1 application onto the skin 2 (two) times daily., Disp: 1 Bottle, Rfl: 3   eszopiclone  (LUNESTA ) 1 MG TABS tablet, Take 1 tablet (1 mg total) by mouth at bedtime as needed for sleep. Take immediately before bedtime, Disp: 30 tablet, Rfl: 0   meloxicam  (MOBIC ) 7.5 MG tablet, Take 1 tablet (7.5 mg total) by mouth daily., Disp: 30 tablet, Rfl: 0   Multiple Vitamin (MULTI-VITAMIN PO), Take by mouth., Disp: , Rfl:    rosuvastatin  (CRESTOR ) 5 MG tablet, TAKE 1 TABLET (5 MG TOTAL) BY MOUTH DAILY., Disp: 30 tablet, Rfl: 5   traZODone  (DESYREL ) 50 MG tablet, Take 1 tablet (50 mg total) by mouth at bedtime as needed for sleep., Disp: 90 tablet, Rfl: 1  Review of Systems:  Negative unless indicated in HPI.   Physical Exam: Vitals:   11/27/23 0929 11/27/23 0932  BP: (!) 150/100 (!) 150/100  Pulse: 70   Temp: 97.9 F (36.6 C)   TempSrc: Oral   SpO2: 98%   Weight: 231 lb 6.4 oz (105 kg)     Body mass index is 30.53 kg/m.  Physical Exam Vitals reviewed.  Constitutional:      Appearance: Normal appearance.  HENT:     Head: Normocephalic and atraumatic.  Eyes:     Conjunctiva/sclera: Conjunctivae normal.  Cardiovascular:     Rate and Rhythm: Normal rate and regular rhythm.  Pulmonary:     Effort: Pulmonary effort is normal.     Breath sounds: Normal breath sounds.  Skin:    General: Skin is warm and dry.  Neurological:     General: No focal deficit present.     Mental Status: He is alert and oriented to person, place, and time.  Psychiatric:        Mood and Affect: Mood normal.        Behavior: Behavior normal.        Thought Content: Thought content normal.        Judgment: Judgment normal.      Impression and Plan:  Primary hypertension  IGT (impaired glucose tolerance)  Hypothyroidism, unspecified type -     TSH; Future  Chronic bilateral low back pain without sciatica -     Meloxicam ; Take 1  tablet (7.5 mg total) by mouth daily.  Dispense: 30 tablet; Refill: 0 -     Ambulatory referral to Sports Medicine  Acute right ankle pain -     Meloxicam ; Take 1 tablet (7.5 mg total) by mouth daily.  Dispense: 30 tablet; Refill: 0 -     Ambulatory referral to Sports Medicine  Depressed mood   - Blood pressure is not well-controlled currently.  Resume amlodipine  5 mg daily, he will do ambulatory blood pressure monitoring and return in 3 months for follow-up. - A1c 5.6, stable. - Check TSH today.  Wonder if his joint aches and depressed mood could be related to an underactive thyroid . - Will give 10 days of meloxicam  to treat both lower back and right ankle pain as well as a referral to sports medicine for further evaluation and management.  Time spent:31 minutes reviewing chart, interviewing and examining patient and formulating plan of care.     Tully Theophilus Andrews, MD [facility]

## 2023-11-29 ENCOUNTER — Ambulatory Visit: Payer: Self-pay | Admitting: Internal Medicine

## 2023-12-03 ENCOUNTER — Encounter: Payer: Self-pay | Admitting: Clinical

## 2023-12-03 ENCOUNTER — Ambulatory Visit: Admitting: Clinical

## 2023-12-03 DIAGNOSIS — F4323 Adjustment disorder with mixed anxiety and depressed mood: Secondary | ICD-10-CM
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
## 2023-12-06 NOTE — Progress Notes (Unsigned)
 [facility] [address] [place] [address] Phone: [phone]   Assessment and Plan:     1. Chronic bilateral low back pain with bilateral sciatica (Primary) 2. Lumbar foraminal stenosis -Chronic with exacerbation, initial visit - Consistent with lumbar degenerative changes and foraminal stenosis leading to chronic and worsening low back pain with radicular symptoms, right greater than left - Reviewed lumbar MRI from 08/19/2020 which showed mild to moderate foraminal stenosis at L4-L5, mild to moderate right and mild left foraminal stenosis at L3-L4, and mild bilateral foraminal stenosis at L5-S1 - Recommend epidural CSI to right sided L4-L5.  Order placed - Start meloxicam  15 mg daily x2 weeks.  If still having pain after 2 weeks, complete 3rd-week of NSAID. May use remaining NSAID as needed once daily for pain control.  Do not to use additional over-the-counter NSAIDs (ibuprofen, naproxen , Advil, Aleve , etc.) while taking prescription NSAIDs.  May use Tylenol  [phone] mg 2 to 3 times a day for breakthrough pain. - Start HEP for low back and core  15 additional minutes spent for educating Therapeutic Home Exercise Program.  This included exercises focusing on stretching, strengthening, with focus on eccentric aspects.   Long term goals include an improvement in range of motion, strength, endurance as well as avoiding reinjury. Patient's frequency would include in 1-2 times a day, 3-5 times a week for a duration of 6-12 weeks. Proper technique shown and discussed handout in great detail with ATC.  All questions were discussed and answered.    3. Chronic pain of right ankle 4. Strain of right tibialis anterior muscle, initial encounter -Chronic with exacerbation, initial visit - Anterior ankle and shin pain x 6 months.  Most consistent with anterior tibialis muscular strain, potentially due to altered gait with ongoing back pain - Start  HEP for ankle - Start meloxicam  15 mg daily x2 weeks.  If still having pain after 2 weeks, complete 3rd-week of NSAID. May use remaining NSAID as needed once daily for pain control.  Do not to use additional over-the-counter NSAIDs (ibuprofen, naproxen , Advil, Aleve , etc.) while taking prescription NSAIDs.  May use Tylenol  [phone] mg 2 to 3 times a day for breakthrough pain.     Pertinent previous records reviewed include lumbar MRI 08/19/20   Follow Up: 2 weeks after epidural.  Could consider repeat epidural if necessary.  Could further x-ray right ankle if no improvement   Subjective:   I, [facility], am serving as a Neurosurgeon for Doctor Morene Mace  Chief Complaint: low back and right ankle   HPI:   12/07/2023 Patient is a 53 year old male with low back and right ankle pain. Patient states right ankle pain intermittent does correlate with low back pain. Pain is located to the top of the ankle radiates to the shin. Pain for about 6 months. No MOI. Does endorse numbness and tingling. Decreased ROM. Ibu for the pain , meloxicam  has been helping .  Low back pain from [employer]. DDD, and herniated disc. Pain radiates to the hips. Decreased ROM. Does have some numbness and tingling. Pain when sitting and standing for a long period time.pain has increased over time. Pain flares  are more constant last longer.    Relevant Historical Information: Hypertension  Additional pertinent review of systems negative.   Current Outpatient Medications:    amLODipine  (NORVASC ) 5 MG tablet, TAKE 1 TABLET (5 MG TOTAL) BY MOUTH DAILY., Disp: 30 tablet, Rfl: 5  Cyanocobalamin (VITAMIN B-12 PO), Take by mouth., Disp: , Rfl:    Diclofenac  Sodium (PENNSAID ) 2 % SOLN, Place 1 application onto the skin 2 (two) times daily., Disp: 1 Bottle, Rfl: 3   eszopiclone  (LUNESTA ) 1 MG TABS tablet, Take 1 tablet (1 mg total) by mouth at bedtime as needed for sleep. Take immediately before bedtime, Disp: 30  tablet, Rfl: 0   meloxicam  (MOBIC ) 7.5 MG tablet, Take 1 tablet (7.5 mg total) by mouth daily., Disp: 30 tablet, Rfl: 0   Multiple Vitamin (MULTI-VITAMIN PO), Take by mouth., Disp: , Rfl:    rosuvastatin  (CRESTOR ) 5 MG tablet, TAKE 1 TABLET (5 MG TOTAL) BY MOUTH DAILY., Disp: 30 tablet, Rfl: 5   traZODone  (DESYREL ) 50 MG tablet, Take 1 tablet (50 mg total) by mouth at bedtime as needed for sleep., Disp: 90 tablet, Rfl: 1   Objective:     Vitals:   12/07/23 1052  BP: 132/86  Pulse: 79  SpO2: 98%  Weight: 228 lb (103.4 kg)  Height: 6' 1 (1.854 m)      Body mass index is 30.08 kg/m.    Physical Exam:    Gen: Appears well, nad, nontoxic and pleasant Psych: Alert and oriented, appropriate mood and affect Neuro: sensation intact, strength is 5/5 in upper and lower extremities, muscle tone wnl Skin: no susupicious lesions or rashes  Back - Normal skin, Spine with normal alignment and no deformity.   No tenderness to vertebral process palpation.   Bilateral lumbar paraspinous muscles are mildly tender and without spasm NTTP gluteal musculature Straight leg raise positive right Trendelenberg positive right Piriformis Test negative Gait normal     Right ankle:  No deformity, no swelling or effusion TTP lateral to tibial shaft along anterior tibialis musculature NTTP over fibular head, lat mal, medial mal, achilles, navicular, base of 5th, ATFL, CFL, deltoid, calcaneous or midfoot ROM DF 30, PF 45, inv/ev intact Negative ant drawer, talar tilt, rotation test, squeeze test. Neg thompson No pain with resisted inversion or eversion  Pain with resisted dorsiflexion.  No pain with resisted plantarflexion  Electronically signed by:  Odis Mace D.CLEMENTEEN [facility] 11:23 AM 12/07/23

## 2023-12-07 ENCOUNTER — Ambulatory Visit: Admitting: Sports Medicine

## 2023-12-07 VITALS — BP 132/86 | HR 79 | Ht 73.0 in | Wt 228.0 lb

## 2023-12-07 DIAGNOSIS — M48061 Spinal stenosis, lumbar region without neurogenic claudication: Secondary | ICD-10-CM

## 2023-12-07 DIAGNOSIS — S86811A Strain of other muscle(s) and tendon(s) at lower leg level, right leg, initial encounter: Secondary | ICD-10-CM | POA: Diagnosis not present

## 2023-12-07 DIAGNOSIS — M5441 Lumbago with sciatica, right side: Secondary | ICD-10-CM

## 2023-12-07 DIAGNOSIS — M25571 Pain in right ankle and joints of right foot: Secondary | ICD-10-CM | POA: Diagnosis not present

## 2023-12-07 DIAGNOSIS — M5442 Lumbago with sciatica, left side: Secondary | ICD-10-CM

## 2023-12-07 DIAGNOSIS — G8929 Other chronic pain: Secondary | ICD-10-CM

## 2023-12-07 NOTE — Patient Instructions (Signed)
-   Start meloxicam  15 mg daily x2 weeks.  If still having pain after 2 weeks, complete 3rd-week of NSAID. May use remaining NSAID as needed once daily for pain control.  Do not to use additional over-the-counter NSAIDs (ibuprofen, naproxen , Advil, Aleve , etc.) while taking prescription NSAIDs.  May use Tylenol  [phone] mg 2 to 3 times a day for breakthrough pain.  Low back ankle HEP   Epidural right L4-5  Follow up 2 weeks after to discuss results

## 2023-12-24 ENCOUNTER — Ambulatory Visit (INDEPENDENT_AMBULATORY_CARE_PROVIDER_SITE_OTHER): Admitting: Clinical

## 2023-12-24 DIAGNOSIS — F4323 Adjustment disorder with mixed anxiety and depressed mood: Secondary | ICD-10-CM | POA: Diagnosis not present

## 2023-12-24 NOTE — Progress Notes (Unsigned)
 [facility] Counselor/Therapist Progress Note - IN-PERSON  Patient ID: [facility], MRN: [id number]    Date: 12/24/2023  Time Spent: 9:26am   - 1028am  : 62 Minutes  Types of Service: Individual psychotherapy  Presenting Concerns:  Chronic back and neck pain that affects his mood & daily functioning.  Also has increased anxiety in specific situations  Mental Status Exam: Appearance:  Neat     Behavior: Appropriate  Motor: Normal  Speech/Language:  Normal Rate  Affect: Appropriate  Mood: anxious  Thought process: normal  Thought content:   WNL  Sensory/Perceptual disturbances:   WNL  Orientation: oriented to person, place, time/date, and situation  Attention: Good  Concentration: Good  Memory: WNL  Fund of knowledge:  Good  Insight:   Good  Judgment:  Good  Impulse Control: Good   Risk Assessment: Danger to Self:  No Self-injurious Behavior: No Danger to Others: No Duty to Warn:no   Subjective:  [name] presents with depressed mood due to chronic pain affecting his daily functioning.  He also reported increased anxiety that can last for the whole day in specific situations, eg presenting at work, when his daughter was playing a regional tournament in tennis.  Interventions: Cognitive Behavioral Therapy, Psycho-education/Bibliotherapy, and Relaxation strategies - progressive muscle relaxation & deep breathing exercises  Client Response: Khasir reported a flare up of his back pain which affected his mood and had difficulties with doing his daily tasks.  He reported feeling better today and was open to increase his pleasant activities and relaxation strategies to manage his chronic pain.  He reported that stretching & deep breathing for relaxation strategies has helped before and he can practice continuing these strategies.  He also plans on playing golf this weekend.  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   Diagnosis:  Adjustment disorder with mixed anxiety and depressed mood   Goals, Assessment & Plan:   [name] is motivated to practice strategies to manage his pain that affects his mood.  And to implement cognitive coping skills to decrease his anxiety level.  Frequency: 1-2 times a month  Modality: In-person    Goal:  Increase his motivation to do the things he use to do, eg golf, going to the gym   Target Date: 03/13/2024  Progress: Ongoing     Goal: Increase his ability to verbalize his thoughts & feelings instead of internalize them.   Target Date: 03/13/2024  Progress: Ongoing    Alyssia Heese P. Trudy, MSW, LCSW [employer] Therapist Main Office: [phone]

## 2024-01-07 ENCOUNTER — Encounter: Payer: Self-pay | Admitting: Clinical

## 2024-01-07 ENCOUNTER — Ambulatory Visit (INDEPENDENT_AMBULATORY_CARE_PROVIDER_SITE_OTHER): Admitting: Clinical

## 2024-01-07 DIAGNOSIS — F411 Generalized anxiety disorder: Secondary | ICD-10-CM

## 2024-01-07 DIAGNOSIS — F321 Major depressive disorder, single episode, moderate: Secondary | ICD-10-CM | POA: Diagnosis not present

## 2024-01-07 NOTE — Progress Notes (Signed)
 [facility] Counselor/Therapist Progress Note - IN-PERSON  Patient ID: Louis Perkins, MRN: [id number]    Date: 01/07/2024  Time Spent: 9:30am   - 10:23am  : 53 Minutes  Types of Service: Individual psychotherapy  Presenting Concerns:  Increased reported anxiety & depressive symptoms that continues to affect his daily functioning  Mental Status Exam: Appearance:  Casual     Behavior: Appropriate  Motor: Normal  Speech/Language:  Normal Rate  Affect: Appropriate  Mood: anxious and depressed  Thought process: normal  Thought content:   WNL  Sensory/Perceptual disturbances:   WNL  Orientation: oriented to person, place, time/date, situation, and day of week  Attention: Good  Concentration: Good  Memory: WNL  Fund of knowledge:  Good  Insight:   Good  Judgment:  Good  Impulse Control: Good     01/07/2024  PHQ-9 Depression Screening Tool   Decreased Interest 2   Down, Depressed, Hopeless 2   Altered sleeping 3   Tired, decreased energy 3   Change in appetite 1   Feeling bad or failure about yourself 2   Trouble concentrating 3   Moving slowly or fidgety/restless 1   Suicidal thoughts 0   PHQ-9 Score 17   Difficult doing work/chores Somewhat difficult   GAD 7 : Generalized Anxiety Score   Nervous, Anxious, on Edge 1   Control/stop worrying 3   Worry too much - different things 2   Trouble relaxing 3   Restless 3   Easily annoyed or irritable 2   Afraid - awful might happen 2   Total GAD 7 Score 16   Anxiety Difficulty Somewhat difficult      Risk Assessment: Danger to Self:  No Self-injurious Behavior: No Danger to Others: No Duty to Warn:no   Subjective:  Louis Perkins reported severe symptoms of anxiety and depression.  He reported that he wasn't able to go to work this past weekend due to his mood.   Interventions: Cognitive Behavioral Therapy and Obtained written consent to exchange information with his PCP.  Client Response: Louis Perkins  reported that he was able to play golf since the last visit.  However, this past weekend, his mood affected his ability to go to work.  Louis Perkins reported an increased in anxiety & depressive symptoms since he is now acknowledging he has it.  He reported that in the past, he would minimize things on the written questionnaire.  He reported a history of thoughts of being better off dead months ago but no current SI. No intent or plan.  He reported that his daughter and family prevents him from harming himself.  Louis Perkins was interested in medication management for anxiety & depression so he will reach out to his PCP about it.  He was also open to learning cognitive coping strategies and increasing his pleasant activities, eg going to the gym in the next 2 weeks.  Louis Perkins's stated goal in the next 2 weeks - Go to the gym twice before next appt with this clinician, starting with tomorrow,Tuesday.   Diagnosis:  Generalized anxiety disorder  Current moderate episode of major depressive disorder, unspecified whether recurrent (HCC)  Collaboration with PCP - Louis Perkins signed consent to exchange information with PCP.  Louis Perkins will follow up with PCP regarding medication management.  Goals, Assessment & Plan:   Louis Perkins continues to verbalize his thoughts & feelings and open to learning additional strategies as well as seeking medication management with his primary care physician. Continue psycho therapy to  learn and implement strategies to decrease his anxiety & depressive symptoms.  Frequency: 1-2 times a month  Modality: In Person    Goal:  Increase his motivation to do the things he use to do, eg golf, going to the gym   - Louis Perkins reported he play golf with his friends the previous weekend.  Target Date: 03/13/2024  Progress: Ongoing      Goal: Increase his ability to verbalize his thoughts & feelings instead of internalize them.   Louis Perkins reported that he's reporting more anxiety & depressive  symptoms since he feels more comfortable talking about it. Since he is acknowledging it more, he is open to learning additional strategies and obtaining additional treatment for himself.   Target Date: 03/13/2024  Progress: Ongoing    Louis Perkins P. Trudy, MSW, LCSW [employer] Therapist Main Office: [phone]

## 2024-01-08 ENCOUNTER — Telehealth: Payer: Self-pay | Admitting: Internal Medicine

## 2024-01-08 NOTE — Telephone Encounter (Signed)
-----   Message from Tully Theophilus Andrews sent at 01/08/2024  6:54 AM EDT ----- Regarding: RE: Collaboration regarding patient care Thank you for the updates! Vernell: can we get him in the office to discuss?  Thanks, Dr. Theophilus ----- Message ----- From: Trudy Rolin [place], [place] Sent: 01/07/2024   2:03 PM EDT To: Tully CINDERELLA Theophilus Andrews, MD Subject: Collaboration regarding patient care           Good afternoon Dr. Theophilus Andrews, Thank you for referring Louis Perkins to our services.  Today is my third time seeing him for psycho therapy.  He is opening up and sharing more anxiety & depressive symptoms that is affecting his daily life.  He wasn't able to go to work this past weekend due to depressive symptoms.  He has a lot of strengths and open to learning more coping strategies.  He asked about medication management and I think he would benefit from both medications and psycho therapy.  I don't think you can see my notes or the results of his PHQ9 /GAD 7 so I wanted to share it with you below.  He signed a consent to exchange information.  We have scheduled follow up appointments as well.  Please let me know if you need additional information.    01/07/2024  PHQ-9 Score Depression = 17  GAD 7 : Generalized Anxiety Score   = 16   Thank you, Jasmine  Jasmine P. Trudy, MSW, LCSW [employer] Therapist Main Office: [phone]

## 2024-01-08 NOTE — Telephone Encounter (Signed)
Left message on machine for patient to schedule an appointment

## 2024-01-08 NOTE — Telephone Encounter (Signed)
 Copied from CRM [id number]. Topic: General - Call Back - No Documentation >> Jan 08, 2024  9:11 AM Eva FALCON wrote: Reason for CRM: Pt was returning call to Rachael. He just wanted to let her know that he scheduled his appointment yesterday for 01/17/24 @ 10AM. Please reach out if further information is needed .

## 2024-01-17 ENCOUNTER — Encounter: Payer: Self-pay | Admitting: Internal Medicine

## 2024-01-17 ENCOUNTER — Ambulatory Visit (INDEPENDENT_AMBULATORY_CARE_PROVIDER_SITE_OTHER): Admitting: Internal Medicine

## 2024-01-17 VITALS — BP 130/86 | HR 72 | Temp 98.2°F | Wt 231.2 lb

## 2024-01-17 DIAGNOSIS — F339 Major depressive disorder, recurrent, unspecified: Secondary | ICD-10-CM | POA: Diagnosis not present

## 2024-01-17 DIAGNOSIS — F411 Generalized anxiety disorder: Secondary | ICD-10-CM | POA: Diagnosis not present

## 2024-01-17 MED ORDER — SERTRALINE HCL 25 MG PO TABS: 25.0000 mg | ORAL_TABLET | Freq: Every day | ORAL | 1 refills | Status: DC

## 2024-01-17 NOTE — Progress Notes (Signed)
 Established Patient Office Visit     CC/Reason for Visit: Depression and anxiety  HPI: Louis Perkins is a 53 y.o. male who is coming in today for the above mentioned reasons.  We received a message from his therapist requesting that he be started on medication.  PHQ-9 score was 17 and GAD-7 was 16.  He continues CBT as well.   Past Medical/Surgical History: Past Medical History:  Diagnosis Date   Depression 03/24   History of chicken pox    Hypertension    Sleep apnea 2023   Thyroid  disease     Past Surgical History:  Procedure Laterality Date   ADENOIDECTOMY  1985   BRAIN SURGERY     KNEE ARTHROSCOPY W/ ACL RECONSTRUCTION Right 2006   Cadaver graft    Social History:  reports that he has never smoked. He has never used smokeless tobacco. He reports current alcohol use. He reports that he does not use drugs.  Allergies: No Known Allergies  Family History:  Family History  Problem Relation Age of Onset   Heart disease Father    Cancer Maternal Uncle        prostate CA   Arthritis Maternal Grandmother    Hypertension Maternal Grandmother    Cancer Cousin        breast CA   Cancer Maternal Aunt    Cancer Maternal Uncle    Diabetes Maternal Uncle    Colon cancer Neg Hx    Colon polyps Neg Hx    Esophageal cancer Neg Hx    Rectal cancer Neg Hx    Stomach cancer Neg Hx      Current Outpatient Medications:    amLODipine  (NORVASC ) 5 MG tablet, TAKE 1 TABLET (5 MG TOTAL) BY MOUTH DAILY., Disp: 30 tablet, Rfl: 5   Cyanocobalamin (VITAMIN B-12 PO), Take by mouth., Disp: , Rfl:    Diclofenac  Sodium (PENNSAID ) 2 % SOLN, Place 1 application onto the skin 2 (two) times daily., Disp: 1 Bottle, Rfl: 3   eszopiclone  (LUNESTA ) 1 MG TABS tablet, Take 1 tablet (1 mg total) by mouth at bedtime as needed for sleep. Take immediately before bedtime, Disp: 30 tablet, Rfl: 0   meloxicam  (MOBIC ) 7.5 MG tablet, Take 1 tablet (7.5 mg total) by mouth daily., Disp: 30  tablet, Rfl: 0   Multiple Vitamin (MULTI-VITAMIN PO), Take by mouth., Disp: , Rfl:    rosuvastatin  (CRESTOR ) 5 MG tablet, TAKE 1 TABLET (5 MG TOTAL) BY MOUTH DAILY., Disp: 30 tablet, Rfl: 5   sertraline (ZOLOFT) 25 MG tablet, Take 1 tablet (25 mg total) by mouth daily., Disp: 90 tablet, Rfl: 1   traZODone  (DESYREL ) 50 MG tablet, Take 1 tablet (50 mg total) by mouth at bedtime as needed for sleep., Disp: 90 tablet, Rfl: 1  Review of Systems:  Negative unless indicated in HPI.   Physical Exam: Vitals:   01/17/24 0956  BP: 130/86  Pulse: 72  Temp: 98.2 F (36.8 C)  TempSrc: Oral  SpO2: 100%  Weight: 231 lb 3.2 oz (104.9 kg)    Body mass index is 30.5 kg/m.    Impression and Plan:  Depression, recurrent -     Sertraline HCl; Take 1 tablet (25 mg total) by mouth daily.  Dispense: 90 tablet; Refill: 1  GAD (generalized anxiety disorder) -     Sertraline HCl; Take 1 tablet (25 mg total) by mouth daily.  Dispense: 90 tablet; Refill: 1   - Start sertraline 25  mg daily.  Return in 3 months for follow-up.  Time spent:30 minutes reviewing chart, interviewing and examining patient and formulating plan of care.     Tully Theophilus Andrews, MD [facility]

## 2024-01-21 ENCOUNTER — Ambulatory Visit (INDEPENDENT_AMBULATORY_CARE_PROVIDER_SITE_OTHER): Admitting: Clinical

## 2024-01-21 DIAGNOSIS — F411 Generalized anxiety disorder: Secondary | ICD-10-CM | POA: Diagnosis not present

## 2024-01-21 DIAGNOSIS — F321 Major depressive disorder, single episode, moderate: Secondary | ICD-10-CM

## 2024-01-21 NOTE — Progress Notes (Signed)
 [facility] Counselor/Therapist Progress Note - IN-PERSON  Patient ID: Louis Perkins, MRN: [id number]    Date: 01/21/2024  Time Spent: 9:29am  - 10:29am  : 60 Minutes  Types of Service: Individual psychotherapy  Presenting Concerns:  Ongoing generalized anxiety and adjusting to taking new medicine  Mental Status Exam: Appearance:  Casual     Behavior: Appropriate  Motor: Normal  Speech/Language:  Normal Rate  Affect: Appropriate  Mood: anxious  Thought process: normal  Thought content:   WNL  Sensory/Perceptual disturbances:   WNL  Orientation: oriented to person, place, time/date, situation, and day of week  Attention: Good  Concentration: Good  Memory: WNL  Fund of knowledge:  Good  Insight:   Good  Judgment:  Good  Impulse Control: Good   Risk Assessment: Danger to Self:  No Self-injurious Behavior: No Danger to Others: No Duty to Warn:no   Subjective:  Louis Perkins reported he started taking 25 mg sertraline after his doctor's visit last Thursday.  He reported ongoing anxiety and depressive symptoms but is hopeful that this will help him.  Interventions: Cognitive Behavioral Therapy and Medication monitoring - Reviewed information CBT triangle (connection with thoughts, emotions, behaviors & somatic symptoms). Reviewed information on automatic thoughts & explored core beliefs.  Client Response: Louis Perkins reported he started taking 25 mg sertraline at night, had some side effects  - nausea with it but trying to take food to see if he helps. He reported that when he started the medicine last Thursday night, he woke up about 1x /night instead of 3-4 times/night.  He will see how this week goes and if the nausea doesn't go away, then he will contact his PCP.  Louis Perkins was open to exploring his core beliefs and learning strategies to change the ones that are increasing his anxiety & depressive symptoms.    Diagnosis:  Generalized anxiety disorder  Current  moderate episode of major depressive disorder, unspecified whether recurrent (HCC)   Goals, Assessment & Plan:   Continue with outpatient individual psycho therapy to learn and implement cognitive coping strategies to decrease symptoms of anxiety & depression.  Frequency: 1-2 times a month  Modality: In Person      Goal:  Increase his motivation to do the things he use to do, eg golf, going to the gym   - Louis Perkins plans on going to the gym this Wednesday.  He went once since the last visit.   Target Date: 03/13/2024  Progress: Ongoing      Goal: Increase his ability to verbalize his thoughts & feelings instead of internalize them.   Louis Perkins identified and talked about one of his core beliefs that are affecting his mood.  He will work on challenging his automatic thoughts that are unhelpful.  And identifying when he starts to get into the unhealthy thinking habits or cognitive distortions of critical self or should/must have (unrealistic goals/expectations)   Target Date: 03/13/2024  Progress: Ongoing     Louis Perkins P. Trudy, MSW, LCSW [employer] Therapist Main Office: [phone]

## 2024-02-04 ENCOUNTER — Ambulatory Visit (INDEPENDENT_AMBULATORY_CARE_PROVIDER_SITE_OTHER): Admitting: Clinical

## 2024-02-04 DIAGNOSIS — F321 Major depressive disorder, single episode, moderate: Secondary | ICD-10-CM | POA: Diagnosis not present

## 2024-02-04 DIAGNOSIS — F411 Generalized anxiety disorder: Secondary | ICD-10-CM | POA: Diagnosis not present

## 2024-02-04 NOTE — Progress Notes (Unsigned)
 [facility] Counselor/Therapist Progress Note - IN-PERSON  Patient ID: Louis Perkins, MRN: [id number]    Date: 02/04/24  Time Spent: 9:30am   - 10:29am  : 59 Minutes  Types of Service: Individual psychotherapy  Presenting Concerns:  Started to wake up again 2-3 times a  night, after a couple of hours  Mental Status Exam: Appearance:  Casual and Neat     Behavior: Appropriate  Motor: Normal  Speech/Language:  Normal Rate  Affect: Appropriate  Mood: anxious and depressed  Thought process: normal  Thought content:   WNL  Sensory/Perceptual disturbances:   WNL  Orientation: oriented to person, place, time/date, situation, and day of week  Attention: Good  Concentration: Good  Memory: WNL  Fund of knowledge:  Good  Insight:   Good  Judgment:  Good  Impulse Control: Good   Risk Assessment: Danger to Self:  No, had one passive thought last week, no intent or plan, denied any current SI. Self-injurious Behavior: No Danger to Others: No Duty to Warn:no   02/04/2024  GAD 7 : Generalized Anxiety Score   Nervous, Anxious, on Edge 1   Control/stop worrying 3   Worry too much - different things 3   Trouble relaxing 1   Restless 1   Easily annoyed or irritable 1   Afraid - awful might happen 1   Total GAD 7 Score 11 (Last score from 01/07/24 was 16)  PHQ-9 Depression Screening Tool   Decreased Interest 1   Down, Depressed, Hopeless 1   Altered sleeping 3   Tired, decreased energy 3   Change in appetite 0   Feeling bad or failure about yourself 1   Trouble concentrating 1   Moving slowly or fidgety/restless 0   Suicidal thoughts 1   PHQ-9 Score 11  (Last score from 01/17/24 was 16)     Subjective:  Louis Perkins reported ongoing anxiety & depressive symptoms that is affecting his daily functioning, especially with his sleep.   Interventions: Cognitive Behavioral Therapy & Medication monitoring. Reviewed results of anxiety & depression screens.  Client  Response: Louis Perkins reported he was able to complete activities that he enjoyed.  He went to the gym twice and played golf the other weekend.  He reported concerns with waking up again at night, due to thoughts that make him feel anxious. When he initially started sertraline , it helped him sleep, however, his sleep pattern is back to how it was before he started it.  He reported no problematic side effects.    He was open to trying strategies and talking to his PCP about medication management. - Try stretches at bedtime - Try writing things down before bed or reflecting on his day, eg challenges or what went well   Diagnosis:  Generalized anxiety disorder  Current moderate episode of major depressive disorder, unspecified whether recurrent (HCC)   Goals, Assessment & Plan:   Louis Perkins reported decrease symptoms of anxiety & depression.  However, he reported that his sleep pattern is back to how it was before he started the medicine, which is affecting his daily functioning.  This Clinician will send update to PCP with Louis Perkins's consent.  Frequency: 1-2 times a month  Modality: In Person      Goal:  Increase his motivation to do the things he use to do, eg golf, going to the gym  - Louis Perkins was able to go to the gym and played golf.  He will try to go to the gym again  this week.    Target Date: 03/13/2024  Progress: Ongoing      Goal: Increase his ability to verbalize his thoughts & feelings instead of internalize them.  Louis Perkins shared more about his thoughts and feelings that made him feel depressed.  He talked about his chronic back pain and feeling stuck in Louis Perkins affects his mood. - He was open to writing some of his thoughts before he sleeps to see if that may help him sleep throughout the night instead of wake up with anxious thoughts.   Target Date: 03/13/2024  Progress: Ongoing      Delron Comer P. Trudy, MSW, LCSW [employer] Therapist Main Office: [phone]

## 2024-02-18 ENCOUNTER — Ambulatory Visit: Admitting: Clinical

## 2024-02-18 DIAGNOSIS — F321 Major depressive disorder, single episode, moderate: Secondary | ICD-10-CM

## 2024-02-18 DIAGNOSIS — F411 Generalized anxiety disorder: Secondary | ICD-10-CM

## 2024-02-18 NOTE — Progress Notes (Signed)
 [facility] Counselor/Therapist Progress Note - IN-PERSON  Patient ID: Louis Perkins, MRN: [id number]    Date: 02/18/24  Time Spent: 9:31am - 10:30am: 59 min total  Types of Service: Individual psychotherapy  Presenting Concerns:  - ongoing difficulties with waking up at night and experiencing poor quality of sleep - increase anxiety & depressive symptoms affected by situations and health concerns  Mental Status Exam: Appearance:  Casual and Neat     Behavior: Appropriate  Motor: Normal  Speech/Language:  Normal Rate  Affect: Appropriate  Mood: anxious and depressed  Thought process: normal  Thought content:   WNL  Sensory/Perceptual disturbances:   WNL  Orientation: oriented to person, place, time/date, situation, and day of week  Attention: Good  Concentration: Good  Memory: WNL  Fund of knowledge:  Good  Insight:   Good  Judgment:  Good  Impulse Control: Good   Risk Assessment: Danger to Self:  No Self-injurious Behavior: No Danger to Others: No Duty to Warn:no   Subjective:  Louis Perkins reported recent situation about a co-worker that affected his mood.  He also has ongoing difficulties with sleep that is affecting his daily functioning.  Louis Perkins concerned about what to do when he has passive SI and interested in developing a safety plan.  Interventions: Cognitive Behavioral Therapy & Medication monitoring. Identified thoughts & situation that increases his depressive or anxiety symptoms.  Identified coping strategies, people and supports he can utilize  Client Response: Louis Perkins reported that he continues to have difficulties with sleep. He did try stretches before bedtime but he stated he still woke up.  He reported he did not try writing things down but can try to do that.  He reported he has not contacted his PCP about medication management.  He may benefit from discussing with his PCP about managing his anxiety & depressive symptoms to improve his  quality of sleep and daily functioning affected by those symptoms.  Louis Perkins reported ongoing chronic back pain. He rated his pain level as 4 every day, rating scale 1-10, 10 being the highest.  He continues to have pain flare ups from his back that occurs doing typical tasks, eg getting an ironing board, getting up from bed.  The pain flare ups he rated above 10, rating scale 1-10, 10 being the highest.  In previous visits, Louis Perkins shared passive SI when he's usually in severe amount of pain.  He is able to think about his family to get through those thoughts, however, he would benefit in developing a safety plan.  During the visit, Louis Perkins developed a safety plan and kept a copy for himself that included contact information for various agencies in a crisis situation.  Diagnosis:  Current moderate episode of major depressive disorder, unspecified whether recurrent (HCC)  Generalized anxiety disorder   Goals, Assessment & Plan:   Louis Perkins reported ongoing concerns with being depressed and anxious.  His thoughts of harming himself are usually when he's in severe physical pain from his back.  He denied any intent or plan to harm himself or others today.  He developed a safety plan and will continue individual psycho therapy.  He would also benefit from discussing with his PCP about medication management for ongoing symptoms.  Louis Perkins also requested a clinical treatment summary to submit to the [facility] medical department regarding his behavioral health treatment.  Therefore, this clinician will provide him a letter of the clinical treatment summary at his next appointment.  Frequency: 1-2 times a month  Modality: In Person      Goal:  Increase his motivation to do the things he use to do, eg golf, going to the gym  - At this time, Louis Perkins reported a decrease in motivation to do things he enjoys.  He reported having depressive symptoms over the Thanksgiving holiday since his daughter was  elsewhere. - He also reported a co-worker that he's known for 20 years just informed him about having a serious illness and that's affected his mood over the weekend.    Target Date: 03/13/2024  Progress: Ongoing      Goal: Increase his ability to verbalize his thoughts & feelings instead of internalize them.  Louis Perkins has made progress with verbalizing his thoughts and feelings about various situations in his life. - Today, he opened up more about his thoughts of harming himself and what he needs to do when they occur.  He was able to identify triggers, coping strategies, and people that he can talk to.  He was also given contact information for crisis situations.  He denied any SI today.  Target Date: 03/13/2024  Progress: Ongoing      Louis Perkins P. Trudy, MSW, LCSW [employer] Therapist Main Office: [phone]

## 2024-02-19 NOTE — Progress Notes (Unsigned)
    [name] [name] [address] [place] [address] Phone: [phone]   Assessment and Plan:     ***    Pertinent previous records reviewed include ***   Follow Up: ***     Subjective:   I, Chestine Reeves, am serving as a neurosurgeon for Doctor Morene Mace  Chief Complaint: ankle and knee pain   HPI:   02/20/2024 Patient is a 53 year old male with ankle and knee pain. Patient states  Relevant Historical Information: ***  Additional pertinent review of systems negative.   Current Outpatient Medications:    amLODipine  (NORVASC ) 5 MG tablet, TAKE 1 TABLET (5 MG TOTAL) BY MOUTH DAILY., Disp: 30 tablet, Rfl: 5   Cyanocobalamin (VITAMIN B-12 PO), Take by mouth., Disp: , Rfl:    Diclofenac  Sodium (PENNSAID ) 2 % SOLN, Place 1 application onto the skin 2 (two) times daily., Disp: 1 Bottle, Rfl: 3   eszopiclone  (LUNESTA ) 1 MG TABS tablet, Take 1 tablet (1 mg total) by mouth at bedtime as needed for sleep. Take immediately before bedtime, Disp: 30 tablet, Rfl: 0   meloxicam  (MOBIC ) 7.5 MG tablet, Take 1 tablet (7.5 mg total) by mouth daily., Disp: 30 tablet, Rfl: 0   Multiple Vitamin (MULTI-VITAMIN PO), Take by mouth., Disp: , Rfl:    rosuvastatin  (CRESTOR ) 5 MG tablet, TAKE 1 TABLET (5 MG TOTAL) BY MOUTH DAILY., Disp: 30 tablet, Rfl: 5   sertraline  (ZOLOFT ) 25 MG tablet, Take 1 tablet (25 mg total) by mouth daily., Disp: 90 tablet, Rfl: 1   traZODone  (DESYREL ) 50 MG tablet, Take 1 tablet (50 mg total) by mouth at bedtime as needed for sleep., Disp: 90 tablet, Rfl: 1   Objective:     There were no vitals filed for this visit.    There is no height or weight on file to calculate BMI.    Physical Exam:    ***   Electronically signed by:  Odis Mace D.[facility]MENT[name] 9:27 AM 02/19/24

## 2024-02-20 ENCOUNTER — Ambulatory Visit

## 2024-02-20 ENCOUNTER — Ambulatory Visit: Admitting: Sports Medicine

## 2024-02-20 VITALS — BP 138/88 | HR 66 | Ht 73.0 in | Wt 226.0 lb

## 2024-02-20 DIAGNOSIS — M1711 Unilateral primary osteoarthritis, right knee: Secondary | ICD-10-CM | POA: Diagnosis not present

## 2024-02-20 DIAGNOSIS — M25571 Pain in right ankle and joints of right foot: Secondary | ICD-10-CM

## 2024-02-20 DIAGNOSIS — M545 Low back pain, unspecified: Secondary | ICD-10-CM | POA: Diagnosis not present

## 2024-02-20 DIAGNOSIS — M25561 Pain in right knee: Secondary | ICD-10-CM

## 2024-02-20 DIAGNOSIS — G8929 Other chronic pain: Secondary | ICD-10-CM

## 2024-02-20 MED ORDER — MELOXICAM 7.5 MG PO TABS: 7.5000 mg | ORAL_TABLET | Freq: Every day | ORAL | 0 refills | Status: DC

## 2024-02-20 MED ORDER — MELOXICAM 15 MG PO TABS: 15.0000 mg | ORAL_TABLET | Freq: Every day | ORAL | 0 refills | Status: AC

## 2024-02-20 NOTE — Patient Instructions (Signed)
-   Start meloxicam  15 mg daily x2 weeks.  If still having pain after 2 weeks, complete 3rd-week of NSAID. May use remaining NSAID as needed once daily for pain control.  Do not to use additional over-the-counter NSAIDs (ibuprofen, naproxen , Advil, Aleve , etc.) while taking prescription NSAIDs.  May use Tylenol  [phone] mg 2 to 3 times a day for breakthrough pain.  Physical therapy with Dr. Elaine Follow up in 6 weeks.

## 2024-02-26 ENCOUNTER — Ambulatory Visit: Payer: Self-pay | Admitting: Sports Medicine

## 2024-02-27 ENCOUNTER — Ambulatory Visit: Admitting: Internal Medicine

## 2024-02-27 ENCOUNTER — Encounter: Payer: Self-pay | Admitting: Internal Medicine

## 2024-02-27 VITALS — BP 122/84 | HR 67 | Temp 97.7°F | Wt 232.9 lb

## 2024-02-27 DIAGNOSIS — F411 Generalized anxiety disorder: Secondary | ICD-10-CM

## 2024-02-27 DIAGNOSIS — F339 Major depressive disorder, recurrent, unspecified: Secondary | ICD-10-CM | POA: Diagnosis not present

## 2024-02-27 MED ORDER — SERTRALINE HCL 50 MG PO TABS: 50.0000 mg | ORAL_TABLET | Freq: Every day | ORAL | 1 refills | Status: DC

## 2024-02-27 NOTE — Progress Notes (Signed)
 Established Patient Office Visit     CC/Reason for Visit: Follow-up anxiety and depression  HPI: Louis Perkins is a 53 y.o. male who is coming in today for the above mentioned reasons. Past Medical History is significant for: Was started on sertraline  25 mg last visit.  He is following routinely with a CBT therapist.  He has noticed some improvement but feels like it could be better.  His therapist agreed and asked him to follow-up with me.   Past Medical/Surgical History: Past Medical History:  Diagnosis Date   Depression 03/24   History of chicken pox    Hypertension    Sleep apnea 2023   Thyroid  disease     Past Surgical History:  Procedure Laterality Date   ADENOIDECTOMY  1985   BRAIN SURGERY     KNEE ARTHROSCOPY W/ ACL RECONSTRUCTION Right 2006   Cadaver graft    Social History:  reports that he has never smoked. He has never used smokeless tobacco. He reports current alcohol use. He reports that he does not use drugs.  Allergies: Allergies[1]  Family History:  Family History  Problem Relation Age of Onset   Heart disease Father    Cancer Maternal Uncle        prostate CA   Arthritis Maternal Grandmother    Hypertension Maternal Grandmother    Cancer Cousin        breast CA   Cancer Maternal Aunt    Cancer Maternal Uncle    Diabetes Maternal Uncle    Colon cancer Neg Hx    Colon polyps Neg Hx    Esophageal cancer Neg Hx    Rectal cancer Neg Hx    Stomach cancer Neg Hx     Current Medications[2]  Review of Systems:  Negative unless indicated in HPI.   Physical Exam: Vitals:   02/27/24 0929  BP: 122/84  Pulse: 67  Temp: 97.7 F (36.5 C)  TempSrc: Oral  SpO2: 99%  Weight: 232 lb 14.4 oz (105.6 kg)    Body mass index is 30.73 kg/m.   Impression and Plan:  GAD (generalized anxiety disorder) -     Sertraline  HCl; Take 1 tablet (50 mg total) by mouth daily.  Dispense: 30 tablet; Refill: 1  Depression, recurrent -      Sertraline  HCl; Take 1 tablet (50 mg total) by mouth daily.  Dispense: 30 tablet; Refill: 1  -Increase sertraline  to 50 mg daily, continue CBT.   Time spent:23 minutes reviewing chart, interviewing and examining patient and formulating plan of care.     Tully Theophilus Andrews, MD  Primary Care at [facility]     [1] No Known Allergies [2]  Current Outpatient Medications:    amLODipine  (NORVASC ) 5 MG tablet, TAKE 1 TABLET (5 MG TOTAL) BY MOUTH DAILY., Disp: 30 tablet, Rfl: 5   Cyanocobalamin (VITAMIN B-12 PO), Take by mouth., Disp: , Rfl:    eszopiclone  (LUNESTA ) 1 MG TABS tablet, Take 1 tablet (1 mg total) by mouth at bedtime as needed for sleep. Take immediately before bedtime, Disp: 30 tablet, Rfl: 0   meloxicam  (MOBIC ) 15 MG tablet, Take 1 tablet (15 mg total) by mouth daily., Disp: 30 tablet, Rfl: 0   Multiple Vitamin (MULTI-VITAMIN PO), Take by mouth., Disp: , Rfl:    rosuvastatin  (CRESTOR ) 5 MG tablet, TAKE 1 TABLET (5 MG TOTAL) BY MOUTH DAILY., Disp: 30 tablet, Rfl: 5   sertraline  (ZOLOFT ) 50 MG tablet, Take 1 tablet (50 mg  total) by mouth daily., Disp: 30 tablet, Rfl: 1   traZODone  (DESYREL ) 50 MG tablet, Take 1 tablet (50 mg total) by mouth at bedtime as needed for sleep., Disp: 90 tablet, Rfl: 1

## 2024-02-29 ENCOUNTER — Ambulatory Visit: Admitting: Physical Therapy

## 2024-02-29 ENCOUNTER — Other Ambulatory Visit: Payer: Self-pay

## 2024-02-29 ENCOUNTER — Encounter: Payer: Self-pay | Admitting: Physical Therapy

## 2024-02-29 DIAGNOSIS — G8929 Other chronic pain: Secondary | ICD-10-CM | POA: Diagnosis not present

## 2024-02-29 DIAGNOSIS — M25561 Pain in right knee: Secondary | ICD-10-CM | POA: Diagnosis not present

## 2024-02-29 DIAGNOSIS — M25551 Pain in right hip: Secondary | ICD-10-CM | POA: Diagnosis not present

## 2024-02-29 DIAGNOSIS — M25571 Pain in right ankle and joints of right foot: Secondary | ICD-10-CM

## 2024-02-29 DIAGNOSIS — M6281 Muscle weakness (generalized): Secondary | ICD-10-CM | POA: Diagnosis not present

## 2024-02-29 DIAGNOSIS — M5459 Other low back pain: Secondary | ICD-10-CM

## 2024-02-29 DIAGNOSIS — M79661 Pain in right lower leg: Secondary | ICD-10-CM

## 2024-02-29 NOTE — Therapy (Signed)
 " OUTPATIENT PHYSICAL THERAPY EVALUATION   Patient Name: Louis Perkins MRN: [id number] DOB:1970/09/06, 53 y.o., male Today's Date: 02/29/2024   END OF SESSION:  PT End of Session - 02/29/24 1123     Visit Number 1    Number of Visits 9    Date for Recertification  04/25/24    Authorization Type [employer]    PT Start Time 1015    PT Stop Time 1105    PT Time Calculation (min) 50 min    Activity Tolerance Patient tolerated treatment well    Behavior During Therapy [facility] for tasks assessed/performed          Past Medical History:  Diagnosis Date   Depression 03/24   History of chicken pox    Hypertension    Sleep apnea 2023   Thyroid  disease    Past Surgical History:  Procedure Laterality Date   ADENOIDECTOMY  1985   BRAIN SURGERY     KNEE ARTHROSCOPY W/ ACL RECONSTRUCTION Right 2006   Cadaver graft   Patient Active Problem List   Diagnosis Date Noted   GAD (generalized anxiety disorder) 01/17/2024   Depression, recurrent 01/17/2024   Cervicalgia 01/18/2022   Hypertension 10/27/2021   OSA (obstructive sleep apnea) 04/21/2021   IGT (impaired glucose tolerance) 03/02/2021   Hyperlipidemia 03/02/2021   Lumbar foraminal stenosis 08/24/2020   Loud snoring 08/16/2020   Vitamin D  deficiency 02/20/2020   Hypothyroidism 02/19/2020   DDD (degenerative disc disease), lumbar 02/19/2020   Insomnia 02/19/2020   Primary osteoarthritis of right knee 01/14/2018   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HCC) 08/21/2016   History of benign brain tumor 07/04/2013   Hearing loss in right ear 09/05/2011   Rotator cuff impingement syndrome 07/19/2011   Achilles tendonitis 07/19/2011   Family history of early CAD 07/19/2011   Elevated BP without diagnosis of hypertension 07/19/2011    PCP: Theophilus Andrews, Tully GRADE, MD   REFERRING PROVIDER: Leonce Katz, DO  REFERRING DIAG: Chronic pain of right knee; Primary osteoarthritis of right knee; Chronic pain of right ankle  THERAPY  DIAG:  Pain in right ankle and joints of right foot  Chronic pain of right knee  Other low back pain  Pain in right lower leg  Pain in right hip  Muscle weakness (generalized)  Rationale for Evaluation and Treatment: Rehabilitation  ONSET DATE: Chronic   SUBJECTIVE:  SUBJECTIVE STATEMENT: Patient reports he has degenerative disc in his back and right hip problems, but he is also having right ankle pain that was shooting up the shin and right knee pains, ongoing for about 6 months. He reports no mechanism of injury, just all of sudden started. The pain seems to be pretty constant and walking will make it worse. The pain in the ankle seems to be a constant sharp pain, then the pain in his shin with walking is more of a jolt, and the knee is more of a dull pain while walking. He does also report pain with working out and he had backed off his lower back exercises. He does report having back and right hip pain for years from previous injuries. When his back pain gets worse he also notices the pain in the ankle and lower leg gets worse. He does get numbness and tingling in the right leg and foot, and sharp pains down the back of his right leg.   PERTINENT HISTORY: See PMH above  PAIN:  Are you having pain? Yes:  NPRS scale: 4/10 Pain  location: Right ankle, up shin to knee Pain description: Sharp, dull Aggravating factors: Waking, constant pain Relieving factors: Medication  NPRS scale: 6/10 Pain location: Lower back, right side Pain description: Sharp Aggravating factors: Walking, activity Relieving factors: Nothing  PRECAUTIONS: None  RED FLAGS: None   WEIGHT BEARING RESTRICTIONS: No  FALLS:  Has patient fallen in last 6 months? No  PLOF: Independent  PATIENT GOALS: Pain relief   OBJECTIVE:  Note: Objective measures were completed at Evaluation unless otherwise noted. PATIENT SURVEYS:  PSFS: 5.75 Walking: 8 Squats: 2 Running: 5 Standing:  8  COGNITION: Overall cognitive status: Within functional limits for tasks assessed      SENSATION: WFL  MUSCLE LENGTH: Hamstring limitation bilaterally  POSTURE:   Grossly WFL  PALPATION: Tender to palpation right lower lumbar around L5 facet region  Patient reports concordant sharp pain with L5 CPA  LUMBAR ROM:   Active  A/PROM  eval  Flexion 75% - sharp pain across lower back  Extension 75% - sharp pain across lower back  Right lateral flexion 75% - sharp pain across lower back  Left lateral flexion 75% - sharp pain across lower back  Right rotation 75%  Left rotation 75%   (Blank rows = not tested)  LOWER EXTREMITY ROM: Hip, knee, and ankle A/PROM grossly WFL, patient does report anterior right ankle pain with active ankle DF but this resolved following right ankle mobilizations, low back pain with bilateral end range hip flexion, right lateral hip pain with hip IR  LOWER EXTREMITY MMT:  MMT Right eval Left eval  Hip flexion 4 4+  Hip extension 4- 4  Hip abduction 4- 4  Hip adduction    Hip internal rotation    Hip external rotation    Knee flexion 5 5  Knee extension 5 5  Ankle dorsiflexion 5 5  Ankle plantarflexion 4 5  Ankle inversion 5 5  Ankle eversion 5 5   (Blank rows = not tested)  FUNCTIONAL TESTS:  Squat: decreased depth, report of low back pain and right ankle pain with increased depth  GAIT: Assistive device utilized: None Level of assistance: Complete Independence Comments: grossly WFL                                                                                                                               TREATMENT  [facility] Adult PT Treatment:                                                DATE: 02/29/2024 Standing heel raises with ball between heels x 10 Side clamshell with green x 10 Seated hamstring stretch x 20 sec  Discussed possible etiologies of symptoms and goal of therapy to address right ankle, knee, hip, and lumbar  deficits.   PATIENT EDUCATION:  Education  details: Exam findings, POC, HEP Person educated: Patient Education method: Explanation, Demonstration, Tactile cues, Verbal cues, and Handouts Education comprehension: verbalized understanding, returned demonstration, verbal cues required, tactile cues required, and needs further education  HOME EXERCISE PROGRAM: Access Code: [email or web address]   ASSESSMENT: CLINICAL IMPRESSION: Patient is a 53 y.o. male who was seen today for physical therapy evaluation and treatment for chronic right anterior ankle pain with pain shooting up medial/anterior shin, right knee pain, right hip pain, and lower back pain. The right ankle/lower leg pain seems to be separate from the right lumbar/hip pain, but unable to rule out lumbar referral completely. He does have right anterior ankle pain with active ankle DF and with activities such as walking, with the pain shooting up the shin with increased levels of activity. He did not report any right knee pain with eval this visit and does exhibit good knee motion. His right ankle active motion is grossly normal but likely is experiencing some anterior ankle impingement of the right. He does exhibit gross strength deficit of the right hip and core region with mobility deficit of the lumbar spine that is likely contributing to his back pain. He also exhibits limitations in bilateral hamstring flexibility.    OBJECTIVE IMPAIRMENTS: decreased activity tolerance, decreased ROM, decreased strength, impaired flexibility, improper body mechanics, and pain.   ACTIVITY LIMITATIONS: lifting, bending, standing, squatting, and locomotion level  PARTICIPATION LIMITATIONS: community activity  PERSONAL FACTORS: Past/current experiences and Time since onset of injury/illness/exacerbation are also affecting patient's functional outcome.   REHAB POTENTIAL: Good  CLINICAL DECISION MAKING: Stable/uncomplicated  EVALUATION COMPLEXITY:  Low   GOALS: Goals reviewed with patient? Yes  SHORT TERM GOALS: Target date: 03/28/2024  Patient will be I with initial HEP in order to progress with therapy. Baseline: hepatitis Goal status: INITIAL  2.  Patient will report right ankle pain </= 2/10 with activity in order to reduce functional limitations Baseline: 4/10 Goal status: INITIAL  LONG TERM GOALS: Target date: 04/25/2024  Patient will be I with final HEP to maintain progress from PT. Baseline: HEP provided at eval Goal status: INITIAL  2.  Patient will report PSFS >/= 8 in order to indicate improvement in their functional ability. Baseline: 5.75 Goal status: INITIAL  3.  Patient will demonstrate right hip strength >/= 4/5 MMT and right ankle strength 5/5 MMT in order to improve his activity tolerance Baseline: see limitations above Goal status: INITIAL  4.  Patient will demonstrate right ankle AROM without increase in ankle pain in order to improve walking tolerance Baseline: anterior ankle pain with ankle DF Goal status: INITIAL   PLAN: PT FREQUENCY: 1x/week  PT DURATION: 8 weeks  PLANNED INTERVENTIONS: 97164- PT Re-evaluation, 97110-Therapeutic exercises, 97530- Therapeutic activity, 97112- Neuromuscular re-education, 97535- Self Care, 02859- Manual therapy, [phone]- Gait training, [phone] (1-2 muscles), 20561 (3+ muscles)- Dry Needling, Patient/Family education, Joint mobilization, Joint manipulation, Cryotherapy, and Moist heat  PLAN FOR NEXT SESSION: Review HEP and progress PRN, manual/mobs for right ankle and lumbar spine, continue with right ankle and hip strengthening, core stabilization, squat progression   Elaine Daring, PT, DPT, LAT, ATC 02/29/2024  11:37 AM Phone: [phone] Fax: [phone]   "

## 2024-02-29 NOTE — Patient Instructions (Signed)
 Access Code: [email or web address] URL: [email or web address] Date: 02/29/2024 Prepared by: Elaine Daring  Exercises - Standing Calf Raise With Small Ball at Heels  - 1 x daily - 3 sets - 10 reps - Clam with Resistance  - 1 x daily - 3 sets - 10 reps - Seated Hamstring Stretch  - 1 x daily - 3 reps - 20 seconds hold

## 2024-03-03 ENCOUNTER — Ambulatory Visit: Admitting: Clinical

## 2024-03-03 ENCOUNTER — Ambulatory Visit: Payer: Self-pay | Admitting: Clinical

## 2024-03-03 DIAGNOSIS — F321 Major depressive disorder, single episode, moderate: Secondary | ICD-10-CM

## 2024-03-03 DIAGNOSIS — F411 Generalized anxiety disorder: Secondary | ICD-10-CM

## 2024-03-03 NOTE — Progress Notes (Unsigned)
 [facility] Counselor/Therapist Progress Note - IN-PERSON  Patient ID: Louis Perkins, MRN: [id number]    Date: 03/03/24  Time Spent: 9:30am   - 10:30am  : 60 Minutes  Types of Service: Individual psychotherapy  Presenting Concerns:  Ongoing anxiety & depressive symptoms affecting his daily functioning. Mr. Malkiewicz requested letter regarding current behavioral health treatment with this therapist.  Mental Status Exam: Appearance:  Neat     Behavior: Appropriate  Motor: Normal  Speech/Language:  Normal Rate  Affect: Appropriate  Mood: anxious and depressed  Thought process: normal  Thought content:   WNL  Sensory/Perceptual disturbances:   WNL  Orientation: oriented to person, place, time/date, and situation  Attention: Good  Concentration: Good  Memory: WNL  Fund of knowledge:  Good  Insight:   Good  Judgment:  Good  Impulse Control: Good   Risk Assessment: Danger to Self:  No Self-injurious Behavior: No Danger to Others: No Duty to Warn:no   Subjective:  Boyd reported difficulties with sleep, anxiety in social situations and depressed mood.   Interventions: Cognitive Behavioral Therapy- Reviewing cognitive coping strategy with identifying unhelpful thoughts and replacing them with more helpful ones.  Client Response: Callum reported anxiety during recent social situations. He was able to distract himself from his thoughts that increased his anxiety symptoms.  Jerek reported difficulties with sleep, which has changed to being more startled awake then slowly waking up throughout the night.  He reported he spoke with his PCP about it and she increased his sertraline .  He will wait to see how he does on the increased dose.  He stated ongoing concerns with his chronic pain affecting his mood and daily functioning.  He requested a clinical treatment summary regarding his behavioral health with this therapist so he can submit it to the [facility]  medical department.  He was given the summary letter at the visit today.   Diagnosis:  Current moderate episode of major depressive disorder, unspecified whether recurrent (HCC)  Generalized anxiety disorder   Goals, Assessment & Plan:   Frequency: 1-2 times a month  Modality: In Person      Goal:  Increase his motivation to do the things he use to do, eg golf, going to the gym  - Zack reported he attended the holiday gathering at work.  He has not been able to play golf or go to the gym recently.     Target Date: 03/13/2024  Progress: Ongoing      Goal: Increase his ability to verbalize his thoughts & feelings instead of internalize them.  [name] shared the recent social situations that made him feel anxious and was able to verbalize how he coped in those situations.   Target Date: 03/13/2024  Progress: Ongoing      Sarahanne Novakowski P. Trudy, MSW, LCSW [employer] Therapist Main Office: [phone]

## 2024-03-03 NOTE — Progress Notes (Signed)
 Louis Perkins

## 2024-03-04 NOTE — Progress Notes (Signed)
" ° °  [facility] [address] [place], [place]  [address] Phone:  [phone]   Fax:  [phone]  March 03, 2024   Patient: Louis Perkins [facility]  Date of Birth: 29-Nov-1970  Date of Visit: 03/03/2024   To Whom It May Concern:  This letter is a summary of [name] current clinical status, diagnoses and the functional impairments observed during our course of treatment.  This Behavioral Health Clinician began services with Louis Perkins on  12/03/2023.     Diagnoses: Current moderate episode of major depressive disorder, unspecified whether recurrent (HCC) [F32.1]  Generalized anxiety disorder [F41.1]   Clinical Summary and Focus of Treatment: Louis Perkins presents with chronic, worsening depressive and anxiety symptoms that he consistently attributes to his chronic physical pain and physical limitations.  A primary focus of our clinical sessions is the grief and hopelessness Louis Perkins experiences regarding the loss of his physical autonomy.  Louis Perkins reports that his chronic lumbar and cervical pain has systematically eroded his ability to engage in the activities that gives his life meaning, specially spending time at his child's sports event, coaching, maintaining hobbies, and engaging in social activities.  Louis Perkins disclosure of recurrent suicidal ideations are of significant clinical concern.  Louis Perkins reports he does not intend to end his life but these thoughts are reactions to the exhaustion of managing chronic pain, the fear that his physical limitations will continue to worsen over time and the feelings of hopelessness that it elicits.  We have developed and implemented a Safety Plan. Despite the recent initiation of pharmacotherapy (Sertraline ), Louis Perkins reports that the medication has not yet alleviated these thoughts, anxiety and depressive symptoms.  Observed Functional Impairment: Based on my clinical assessments, [name] exhibits impairments in both social and occupational spheres.  Social Withdrawal - he reports isolating himself from friends and family because he lacks the energy to mask his pain, depression, and anxiety in public.  Occupational Impact - he describes a loss of motivation and difficulty with focus, concentration, and sleep that impacts his daily efficiency. Anhedonia - He demonstrates a marked loss of interest in activities he used to enjoy.  Current concerns that are being addressed during behavioral health sessions: Depressed mood Anxiety - hyper-vigilance in crowds/public places Suicidal ideations Chronic sleep impairments Difficulty in establish and maintaining effective work and social relationships.    Sincerely,    Zhania Shaheen P. Trudy, MSW, LCSW  Licensed Clinical [employer] Main Office: [phone]           "

## 2024-03-11 ENCOUNTER — Encounter: Admitting: Physical Therapy

## 2024-03-14 NOTE — Progress Notes (Signed)
 [facility] Counselor/Therapist Progress Note - IN-PERSON  Patient ID: Louis Perkins, MRN: [id number]    Date: 03/17/2024  Time Spent: 9:28 am 10:22 54 Minutes  Types of Service: Individual psychotherapy  Presenting Concerns:  - Ongoing anxiety in social situations that prevents him from enjoying things with his family  Mental Status Exam: Appearance:  Neat     Behavior: Appropriate  Motor: Normal  Speech/Language:  Normal Rate  Affect: Appropriate  Mood: anxious  Thought process: normal  Thought content:   WNL  Sensory/Perceptual disturbances:   WNL  Orientation: oriented to person, place, time/date, and situation  Attention: Good  Concentration: Good  Memory: WNL  Fund of knowledge:  Good  Insight:   Good  Judgment:  Good  Impulse Control: Good   Risk Assessment: Danger to Self:  No Self-injurious Behavior: No Danger to Others: No Duty to Warn:no   Subjective:  Louis Perkins reported things are going well overall, although there were a couple situations when he didn't feel like doing things he enjoyed and increased anxiety in a social situation  Interventions:   Cognitive Behavioral Therapy- Identified thoughts, feelings & actions in a situation that was making him feel anxious.  Identified accomplishments.  Client Response: Louis Perkins reported that he's feeling better since he's had some time off work.  He also reported he has had a few nights of sleeping better the past couple weeks.  He thought it may be due to having time off work, increased in sertraline  and taking the medicine for sleep a couple nights.  He has not woken up startled like he reported a few weeks ago.  He reported he had one night he slept for 6 hours without waking up, which is not usual for him.  Louis Perkins identified thoughts, feelings & actions regarding a situation that he was uncomfortable in.  It was with people he knew but it was out in a restaurant.  He was open to strategies to help him  through similar situations and to try another activity with his daughter & her mother the next time they ask him to do something outside the home.  Diagnosis:  Generalized anxiety disorder  Current moderate episode of major depressive disorder, unspecified whether recurrent (HCC)   Goals, Assessment & Plan:   Frequency: 1-2 times a month  Modality: In Person      Goal:  Increase his motivation to do the things he use to do, eg golf, going to the gym  - Louis Perkins reported he played golf twice in the past 2 weeks.  Although he didn't feel like going the second time, he made a decision to go anyway and stated he ended up having a good time.    Target Date: 08/11/2024  Progress: Ongoing      Goal (revised) Increase his ability to verbalize his thoughts & feelings instead of internalize them with others. And increase his interactions with others. - Louis Perkins reported that he made an effort to go out to eat with his daughter and others but it increased his anxiety.  He was open to doing another activity in order to practice strategies to help him get through those situations when he feels anxious or uncomfortable.   Louis Perkins shared that a few months ago he told his ex-wife about therapy and she told him she's noticed him feeling depressed and anxious for years.  He was able to reflect that she tries to invite to do things to help him feel better.  Target Date: 08/11/2024   Progress: Ongoing     Louis Needs P. Trudy, MSW, LCSW [employer] Therapist Main Office: [phone]

## 2024-03-17 ENCOUNTER — Ambulatory Visit (INDEPENDENT_AMBULATORY_CARE_PROVIDER_SITE_OTHER): Admitting: Clinical

## 2024-03-17 DIAGNOSIS — F321 Major depressive disorder, single episode, moderate: Secondary | ICD-10-CM | POA: Diagnosis not present

## 2024-03-17 DIAGNOSIS — F411 Generalized anxiety disorder: Secondary | ICD-10-CM

## 2024-03-18 ENCOUNTER — Encounter: Admitting: Physical Therapy

## 2024-03-22 ENCOUNTER — Other Ambulatory Visit: Payer: Self-pay | Admitting: Internal Medicine

## 2024-03-22 DIAGNOSIS — F339 Major depressive disorder, recurrent, unspecified: Secondary | ICD-10-CM

## 2024-03-22 DIAGNOSIS — F411 Generalized anxiety disorder: Secondary | ICD-10-CM

## 2024-03-26 ENCOUNTER — Encounter: Admitting: Physical Therapy

## 2024-03-31 ENCOUNTER — Ambulatory Visit: Admitting: Clinical

## 2024-04-01 ENCOUNTER — Encounter: Admitting: Physical Therapy

## 2024-04-11 ENCOUNTER — Encounter: Payer: Self-pay | Admitting: Clinical

## 2024-04-14 ENCOUNTER — Ambulatory Visit: Admitting: Clinical

## 2024-04-14 NOTE — Progress Notes (Unsigned)
 [facility] Counselor Progress Note - TELEMEDICINE VISIT  Patient ID: Louis Perkins, MRN: [id number]    Date: 04/14/2024  Time Spent: ***   - ***  : *** Minutes  Types of Service: Individual psychotherapy and Video visit  Client and/or Legal Guardian location: *** Therapist location: Remote work - [place], [place] All persons participating in visit: Client & this therapist  I connected with client and/or legal guardian via Video Enabled Telemedicine Application  (Video is Caregility application) and verified that I am speaking with the correct person using two identifiers. Discussed confidentiality: {YES/NO:21197}  I discussed the limitations of telemedicine and the availability of in person appointments.  Discussed there is a possibility of technology failure and discussed alternative modes of communication if that failure occurs.  I discussed that engaging in this telemedicine visit, they consent to the provision of behavioral healthcare and the services will be billed under their insurance.  Client and/or legal guardian expressed understanding and consented to Telemedicine visit: {YES/NO:21197}   Presenting Concerns: ***  Mental Status Exam: Appearance:  {PSY:22683}     Behavior: {PSY:21022743}  Motor: {PSY:22302}  Speech/Language:  {PSY:22685}  Affect: {PSY:22687}  Mood: {PSY:31886}  Thought process: {PSY:31888}  Thought content:   {PSY:424-021-2146}  Sensory/Perceptual disturbances:   {PSY:234-577-6979}  Orientation: {PSY:30297}  Attention: {PSY:22877}  Concentration: {PSY:270-014-9052}  Memory: {[phone]}  Fund of knowledge:  {PSY:270-014-9052}  Insight:   {PSY:270-014-9052}  Judgment:  {PSY:270-014-9052}  Impulse Control: {PSY:270-014-9052}   Risk Assessment: Danger to Self:  {PSY:22692} Self-injurious Behavior: {PSY:22692} Danger to Others: {PSY:22692} Duty to Warn:{PSY:311194}   Subjective:  ***   Interventions: {PSY:[id number]}  Client  Response: ***   Diagnosis:  No diagnosis found.   Goals, Assessment & Plan:   ***  Treatment Level {Frequency of sessions.:26745}  Modality - TeleMedicine - Video  Goal:  ***  Target Date: ***  Progress: ***     Goals: ***  Target Date: ***  Progress: ***    Conard Alvira P. Trudy, MSW, LCSW [employer] Therapist Main Office: [phone]

## 2024-04-28 ENCOUNTER — Ambulatory Visit: Admitting: Clinical

## 2024-05-12 ENCOUNTER — Ambulatory Visit: Admitting: Clinical

## 2024-05-26 ENCOUNTER — Ambulatory Visit: Admitting: Clinical

## 2024-06-23 ENCOUNTER — Ambulatory Visit: Admitting: Clinical

## 2024-07-07 ENCOUNTER — Ambulatory Visit: Admitting: Clinical
